# Patient Record
Sex: Female | Born: 1975 | Race: White | Hispanic: No | Smoking: Never smoker
Health system: Southern US, Community
[De-identification: ages and names within clinical notes are randomized; demographics above are authoritative.]

## PROBLEM LIST (undated history)

## (undated) DIAGNOSIS — F32A Depression, unspecified: Secondary | ICD-10-CM

## (undated) DIAGNOSIS — R519 Headache, unspecified: Secondary | ICD-10-CM

## (undated) DIAGNOSIS — F419 Anxiety disorder, unspecified: Secondary | ICD-10-CM

## (undated) DIAGNOSIS — F329 Major depressive disorder, single episode, unspecified: Secondary | ICD-10-CM

## (undated) DIAGNOSIS — R51 Headache: Secondary | ICD-10-CM

## (undated) DIAGNOSIS — R5383 Other fatigue: Secondary | ICD-10-CM

## (undated) HISTORY — DX: Anxiety disorder, unspecified: F41.9

## (undated) HISTORY — PX: BARIATRIC SURGERY: SHX1103

## (undated) HISTORY — DX: Depression, unspecified: F32.A

## (undated) HISTORY — DX: Headache: R51

## (undated) HISTORY — DX: Headache, unspecified: R51.9

## (undated) HISTORY — DX: Other fatigue: R53.83

## (undated) HISTORY — DX: Major depressive disorder, single episode, unspecified: F32.9

---

## 1998-07-30 ENCOUNTER — Emergency Department (HOSPITAL_COMMUNITY): Admission: EM | Admit: 1998-07-30 | Discharge: 1998-07-30 | Payer: Self-pay | Admitting: Emergency Medicine

## 2002-06-22 ENCOUNTER — Other Ambulatory Visit: Admission: RE | Admit: 2002-06-22 | Discharge: 2002-06-22 | Payer: Self-pay | Admitting: *Deleted

## 2003-05-10 ENCOUNTER — Other Ambulatory Visit: Admission: RE | Admit: 2003-05-10 | Discharge: 2003-05-10 | Payer: Self-pay | Admitting: Gynecology

## 2008-01-21 ENCOUNTER — Ambulatory Visit (HOSPITAL_COMMUNITY): Admission: RE | Admit: 2008-01-21 | Discharge: 2008-01-21 | Payer: Self-pay | Admitting: *Deleted

## 2008-02-09 ENCOUNTER — Encounter: Admission: RE | Admit: 2008-02-09 | Discharge: 2008-02-09 | Payer: Self-pay | Admitting: *Deleted

## 2008-06-03 ENCOUNTER — Encounter: Admission: RE | Admit: 2008-06-03 | Discharge: 2008-07-19 | Payer: Self-pay | Admitting: *Deleted

## 2008-06-27 ENCOUNTER — Ambulatory Visit (HOSPITAL_COMMUNITY): Admission: RE | Admit: 2008-06-27 | Discharge: 2008-06-27 | Payer: Self-pay | Admitting: *Deleted

## 2009-02-13 ENCOUNTER — Emergency Department (HOSPITAL_COMMUNITY): Admission: EM | Admit: 2009-02-13 | Discharge: 2009-02-13 | Payer: Self-pay | Admitting: Emergency Medicine

## 2011-02-06 LAB — COMPREHENSIVE METABOLIC PANEL
BUN: 10 mg/dL (ref 6–23)
CO2: 27 mEq/L (ref 19–32)
Chloride: 103 mEq/L (ref 96–112)
Creatinine, Ser: 0.85 mg/dL (ref 0.4–1.2)
GFR calc non Af Amer: >60 mL/min (ref >60)
Glucose, Bld: 87 mg/dL (ref 70–99)
Total Bilirubin: 0.7 mg/dL (ref 0.3–1.2)

## 2011-02-06 LAB — PREGNANCY, URINE: Preg Test, Ur: NEGATIVE

## 2011-02-06 LAB — URINALYSIS, ROUTINE W REFLEX MICROSCOPIC
Glucose, UA: NEGATIVE mg/dL
Ketones, ur: 15 mg/dL — AB
Nitrite: NEGATIVE
Protein, ur: NEGATIVE mg/dL

## 2011-02-06 LAB — CBC
HCT: 40 % (ref 36.0–46.0)
Hemoglobin: 13.5 g/dL (ref 12.0–15.0)
WBC: 12.6 10*3/uL — ABNORMAL HIGH (ref 4.0–10.5)

## 2011-02-06 LAB — DIFFERENTIAL
Eosinophils Relative: 3 % (ref 0–5)
Lymphocytes Relative: 31 % (ref 12–46)
Lymphs Abs: 4 10*3/uL (ref 0.7–4.0)
Monocytes Absolute: 0.7 10*3/uL (ref 0.1–1.0)

## 2011-03-12 NOTE — Op Note (Signed)
NAME:  Veronica Lawrence, Veronica Lawrence                ACCOUNT NO.:  [id number]   MEDICAL RECORD NO.:  [id number]          PATIENT TYPE:  AMB   LOCATION:  DAY                          FACILITY:  [facility]   PHYSICIAN:  Alfonse Ras, MD   DATE OF BIRTH:  04-30-1976   DATE OF PROCEDURE:  DATE OF DISCHARGE:                               OPERATIVE REPORT   ADDENDUM   DIAGNOSIS:  Medically refractory morbid obesity.  BMI is 47.2.      Alfonse Ras, MD  Electronically Signed     KRE/MEDQ  D:  06/27/2008  T:  06/28/2008  Job:  [id number]

## 2011-03-12 NOTE — Op Note (Signed)
NAME:  Veronica Lawrence, Veronica Lawrence                ACCOUNT NO.:  [id number]   MEDICAL RECORD NO.:  [id number]          PATIENT TYPE:  AMB   LOCATION:  DAY                          FACILITY:  [facility]   PHYSICIAN:  Alfonse Ras, MD   DATE OF BIRTH:  [birth year]   DATE OF PROCEDURE:  DATE OF DISCHARGE:                               OPERATIVE REPORT   PREOPERATIVE DIAGNOSIS:  Medically refractory morbid obesity.   POSTOPERATIVE DIAGNOSIS:  Medically refractory morbid obesity.   PROCEDURE:  Laparoscopic adjustable gastric banding with an APS Allergan  system and subcutaneous port placement.   SURGEON:  Alfonse Ras, M.D.   ASSISTANT:  Thornton Park. Daphine Deutscher, M.D.   ANESTHESIA:  General.   DESCRIPTION OF PROCEDURE:  The patient was taken to the operating room  and placed in supine position.  After adequate anesthesia was induced  using endotracheal tube, the abdomen was prepped and draped in normal  sterile fashion.  Using an 11-mm OptiVu trocar in the left upper  quadrant, peritoneal access was obtained under direct vision.  Pneumoperitoneum was obtained.  Additional 15-mm and 11-mm ports were  placed in the right abdomen and 11-mm port was placed in the right  paramedian position.  Nathanson liver retractor was introduced in the  subxiphoid region.  The left lateral segment liver was retracted.  Angle  of Hiss was sharply and bluntly dissected.  Using a pars flaccida  technique, the band passer was placed in a retrogastric position and  brought out at the angle of Hiss.  An APS band was then introduced and  passed retrogastric.  The sizing balloon or sizing tube was placed down,  insufflated with 15 mL of air and retracted.  There was no evidence of  hiatal hernia.  The tubing was then snapped down around the tubing and  moved easily.  The sizing tube was removed.  Anterior fundoplication was  performed with three 2-0 Ethilon sutures in the standard fashion.  Tubing was brought out through the  right upper quadrant incision.  The  tubing was attached to the port.  Nathanson liver retractor was removed.  All trocars were removed after pneumoperitoneum was released.  A small  pocket was bluntly made in the subcutaneous tissue just posterior to  Scarpa fascia and the port was placed within this pocket.  The tubing  laid nicely.  Scarpa fascia was closed with interrupted 4-0 Vicryl  sutures.  Skin incision was closed with 3-0 Monocryl.  Other skin  incisions were closed with subcuticular 4-0 Vicryl.  Dermabond and Steri-  Strips were placed.  Sterile dressings were applied.  The patient  tolerated the procedure well, went to PACU in good condition.      Alfonse Ras, MD  Electronically Signed     KRE/MEDQ  D:  06/27/2008  T:  06/28/2008  Job:  [id number]

## 2011-12-31 ENCOUNTER — Telehealth (INDEPENDENT_AMBULATORY_CARE_PROVIDER_SITE_OTHER): Payer: Self-pay | Admitting: Surgery

## 2011-12-31 NOTE — Telephone Encounter (Signed)
12/31/11 mailed recall letter for bariatric surgery follow-up. Advised the patient to call [facility] @ [phone] to schedule an appointment...cef °

## 2012-11-17 DIAGNOSIS — K219 Gastro-esophageal reflux disease without esophagitis: Secondary | ICD-10-CM | POA: Insufficient documentation

## 2013-02-04 ENCOUNTER — Telehealth (INDEPENDENT_AMBULATORY_CARE_PROVIDER_SITE_OTHER): Payer: Self-pay | Admitting: Surgery

## 2013-02-04 NOTE — Telephone Encounter (Signed)
02/04/13 lm and mailed recall letter for pt to schedule bariatric follow-up appt. Dr. Daphine Deutscher did lap band surgery 06/27/08. (lss)

## 2013-07-16 DIAGNOSIS — M545 Low back pain, unspecified: Secondary | ICD-10-CM | POA: Insufficient documentation

## 2013-07-28 HISTORY — PX: BACK SURGERY: SHX140

## 2014-05-28 HISTORY — PX: BACK SURGERY: SHX140

## 2015-03-09 ENCOUNTER — Ambulatory Visit (HOSPITAL_COMMUNITY): Payer: Managed Care, Other (non HMO) | Admitting: Licensed Clinical Social Worker

## 2015-03-09 ENCOUNTER — Ambulatory Visit (INDEPENDENT_AMBULATORY_CARE_PROVIDER_SITE_OTHER): Payer: Managed Care, Other (non HMO) | Admitting: Psychiatry

## 2015-03-09 ENCOUNTER — Encounter (HOSPITAL_COMMUNITY): Payer: Self-pay | Admitting: Psychiatry

## 2015-03-09 VITALS — BP 126/80 | HR 68 | Ht 66.0 in | Wt 210.0 lb

## 2015-03-09 DIAGNOSIS — F1190 Opioid use, unspecified, uncomplicated: Secondary | ICD-10-CM

## 2015-03-09 DIAGNOSIS — F411 Generalized anxiety disorder: Secondary | ICD-10-CM | POA: Diagnosis not present

## 2015-03-09 DIAGNOSIS — F329 Major depressive disorder, single episode, unspecified: Secondary | ICD-10-CM

## 2015-03-09 DIAGNOSIS — M549 Dorsalgia, unspecified: Secondary | ICD-10-CM

## 2015-03-09 DIAGNOSIS — G8929 Other chronic pain: Secondary | ICD-10-CM | POA: Diagnosis not present

## 2015-03-09 DIAGNOSIS — F32A Depression, unspecified: Secondary | ICD-10-CM

## 2015-03-09 MED ORDER — BUPROPION HCL 100 MG PO TABS: 100.0000 mg | ORAL_TABLET | Freq: Every morning | ORAL | Status: DC

## 2015-03-09 NOTE — Progress Notes (Signed)
Patient ID: Gwenlyn FudgeCynthia M Schwer, female   DOB: 03/24/1976, 39 y.o.   MRN: [id number]  [facility]   Gwenlyn FudgeCynthia M Rhoda [id number] 38 y.o.  03/09/2015 11:09 AM  Chief Complaint:  Depression. Opiate withdrawal in past. stress  History of Present Illness:   Patient Presents for Initial Evaluation with symptoms of stress, anxiety. Patient is a 39 years old, single Caucasian female was following with Dr.  Jean RosenthalJackson.  Patient is on pain medication hydrocodone takes as prescribed. She has been on medications for more than a year because of back problem including herniated disc she has had 2 surgeries. A month ago she stopped cold Malaw[place]urkey and started having withdrawal including nausea muscle ache diarrhea. She also had panic attack and anxiety. She was put back on medication now she is taking it her depression is improved does not endorse hopelessness helplessness. She was referred because of opiate withdrawal and to consider psychotherapy. Says that she did not want to be on long-term pain medication for which she stopped. She was also referred to Narcotics Anonymous  In regarding to depression she has had depression before she has had divorced 2 years ago. Her job is stressful but she stills like it.  She feels her depression and anxiety are manageable with the current medication regimen she is continuing to take pain medications and she does endorse feeling somewhat tired and that affects her daytime functioning at times. Modifying factors include she goes to the gym but has limited his social activities.  Severity of anxiety. 4 out of 10. 10 being extreme anxiety. Says that anxiety is better now she is on pack on pain medication he also takes Cymbalta thus also depression and anxiety. Severity of depression; 8 out of 10. 10 being no depression Context; pain, at times job stress Associate symptoms; she does worry associates worries with anxiety racing thoughts at  night. Some muscle tightening and difficulty sleeping. She does believe her worries are excessive and unreasonable at times. She worries about her physical health including her pain and treatment options. There is no suicidal symptoms of psychosis, mania hypomania in the past. No history of physical or sexual abuse. Her marriage was for 7 years but it was more so emotional distant and he was not working so she had to let him go    infrequent use of alcohol no history of alcoholism.    Past Psychiatric History/Hospitalization(s) No. Has followed with primar care for depression and anxiety.  Hospitalization for psychiatric illness: No History of Electroconvulsive Shock Therapy: No Prior Suicide Attempts: No  Medical History; Past Medical History  Diagnosis Date  . Anxiety   . Depression   . Fatigue   . Headache     Allergies: Allergies  Allergen Reactions  . Nsaids Nausea And Vomiting    Medications: Outpatient Encounter Prescriptions as of 03/09/2015  Medication Sig  . DULoxetine (CYMBALTA) 60 MG capsule   . HYDROcodone-acetaminophen (NORCO) 10-325 MG per tablet Take 1 tablet by mouth.  . Multiple Vitamin (THERA) TABS Take 1 tablet by mouth.  Marland Kitchen. buPROPion (WELLBUTRIN) 100 MG tablet Take 1 tablet (100 mg total) by mouth every morning.  . [DISCONTINUED] amphetamine-dextroamphetamine (ADDERALL) 15 MG tablet Take 15 mg by mouth.  . [DISCONTINUED] desogestrel-ethinyl estradiol (APRI,EMOQUETTE,SOLIA) 0.15-30 MG-MCG tablet Take 1 tablet by mouth.  . [DISCONTINUED] diazepam (VALIUM) 5 MG tablet   . [DISCONTINUED] DULoxetine (CYMBALTA) 60 MG capsule   . [DISCONTINUED] gabapentin (NEURONTIN) 300 MG capsule   . [  DISCONTINUED] HYDROcodone-acetaminophen (NORCO/VICODIN) 5-325 MG per tablet   . [DISCONTINUED] predniSONE (STERAPRED UNI-PAK) 10 MG tablet    No facility-administered encounter medications on file as of 03/09/2015.     Substance Abuse History: Denies.  Infrequent use of  alcohol . No alcoholilsm or regular use.   Family History; Family History  Problem Relation Age of Onset  . Dementia Maternal Grandmother      Biopsychosocial History:  She grew the parents had one sibling. Growing up was "no physical or sexual abuse. She did finish high school currently she is working as a Production designer, theatre/television/filmmanager at [employer]. She is remarried in the past for 7 years but they grew apart. No current legal issue. Patient does not have any kids    Labs:  No results found for this or any previous visit (from the past 2160 hour(s)).     Musculoskeletal: Strength & Muscle Tone: within normal limits Gait & Station: normal Patient leans: N/A  Mental Status Examination;   Psychiatric Specialty Exam: Physical Exam  Constitutional: She appears well-developed and well-nourished. No distress.    Review of Systems  Constitutional: Positive for malaise/fatigue.  Gastrointestinal: Negative for nausea.  Musculoskeletal: Positive for back pain.  Skin: Negative for rash.  Neurological: Negative for headaches.  Psychiatric/Behavioral: Negative for suicidal ideas.    Blood pressure 126/80, pulse 68, height 5\' 6"  (1.676 m), weight 210 lb (95.255 kg), SpO2 97 %.Body mass index is 33.91 kg/(m^2).  General Appearance: Casual  Eye Contact::  Good  Speech:  Normal Rate  Volume:  Normal  Mood:  Dysphoric somewhat but not hopeless. More so tired .  Affect:  Constricted  Thought Process:  Coherent  Orientation:  Full (Time, Place, and Person)  Thought Content:  Rumination  Suicidal Thoughts:  No  Homicidal Thoughts:  No  Memory:  Immediate;   Fair Recent;   Fair  Judgement:  Fair  Insight:  Fair  Psychomotor Activity:  Normal  Concentration:  Fair  Recall:  Fair  Akathisia:  Negative  Handed:  Right  AIMS (if indicated):     Assets:  Desire for Improvement Financial Resources/Insurance  Sleep:        Assessment: Axis I:  dysthymia. Generalized anxiety disorder. Chronic back  pain. Opiate use disorder prescribed   Axis II Deferred Axis III:  Past Medical History  Diagnosis Date  . Anxiety   . Depression   . Fatigue   . Headache     Axis : Psychosocial    Treatment Plan and Summary: Patient is not in any withdrawal as of now she is taking her pain medication. Her main concern today and tiredness.  Depression wise she is doing reasonably talked about other possibilities of causing tiredness including possible sleep apnea. Also schedule more activities and she is not working including hobbies.  Recommend psychotherapy to review underlying depression and stress levels.  I will add Wellbutrin 100 mg to augment as an antidepressant also for the tiredness.  She'll talk with her primary care physician in case she wants to reduce the pain medication to 2 tablets slowly in case there is that may help her tiredness without compromising her pain   Pertinent Labs and Relevant Prior Notes reviewed. Medication Side effects, benefits and risks reviewed/discussed with Patient. Time given for patient to respond and asks questions regarding the Diagnosis and Medications. Safety concerns and to report to ER if suicidal or call 911. Relevant Medications refilled or called in to pharmacy. Discussed weight maintenance and Sleep  Hygiene. Follow up with Primary care provider in regards to Medical conditions. Recommend compliance with medications and follow up office appointments. Discussed to avail opportunity to consider or/and continue Individual therapy with Counselor. Greater than 50% of time was spend in counseling and coordination of care with the patient.  Schedule for Follow up visit in 4 weeks  or call in earlier as necessary.   Thresa Ross, MD 03/09/2015

## 2015-03-30 ENCOUNTER — Telehealth (HOSPITAL_COMMUNITY): Payer: Self-pay | Admitting: *Deleted

## 2015-03-30 NOTE — Telephone Encounter (Signed)
PT called for a refill for Wellbutrin. Pt was seen by Dr. Gilmore LarocheAkhtar on 5/12. Pt states she is out of medication and will need a refill due to her taking 2 tablets daily. Informed pt I will need to speak with Dr. Gilmore LarocheAkhtar for approval before a refill can be issued. Pt states she will call tomorrow to speak with Dr. Gilmore LarocheAkhtar.

## 2015-03-31 ENCOUNTER — Telehealth (HOSPITAL_COMMUNITY): Payer: Self-pay

## 2015-03-31 MED ORDER — BUPROPION HCL 100 MG PO TABS: 100.0000 mg | ORAL_TABLET | Freq: Every morning | ORAL | Status: DC

## 2015-03-31 MED ORDER — BUPROPION HCL 100 MG PO TABS: ORAL_TABLET | ORAL | Status: DC

## 2015-03-31 NOTE — Telephone Encounter (Signed)
wellbutrin refill sent for 100mg  qd.

## 2015-03-31 NOTE — Telephone Encounter (Signed)
wellbutrin dose changed to 200mg  and sent to pharmacy

## 2015-03-31 NOTE — Telephone Encounter (Signed)
PT needs a refill for her wellbutrin she did start taking 2 a day per her conversation with you so she is out of the medication sooner than she should be. buPROPion (WELLBUTRIN) 100 MG tablet The refill needs to be for 200mg .

## 2015-04-13 ENCOUNTER — Ambulatory Visit (HOSPITAL_COMMUNITY): Payer: Self-pay | Admitting: Psychiatry

## 2015-04-26 ENCOUNTER — Encounter (HOSPITAL_COMMUNITY): Payer: Self-pay | Admitting: Licensed Clinical Social Worker

## 2015-04-26 ENCOUNTER — Ambulatory Visit (INDEPENDENT_AMBULATORY_CARE_PROVIDER_SITE_OTHER): Payer: Managed Care, Other (non HMO) | Admitting: Licensed Clinical Social Worker

## 2015-04-26 ENCOUNTER — Other Ambulatory Visit (HOSPITAL_COMMUNITY): Payer: Self-pay | Admitting: Psychiatry

## 2015-04-26 DIAGNOSIS — F321 Major depressive disorder, single episode, moderate: Secondary | ICD-10-CM | POA: Insufficient documentation

## 2015-04-26 NOTE — Progress Notes (Signed)
Patient:   Veronica Lawrence   DOB:   11/06/1975  MR Number:  [id number]  [facility]  [facility] [address] [place] [id number] Dept: [phone]           Date of Service:   04/26/15 Start Time:   2:30pm End Time:   3:15pm  Provider/Observer:  Marilu FavreSarah A Solomon Clinical Social Work       Billing Code/Service: [phone]  Comprehensive Clinical Assessment  Information for assessment provided by: patient   Chief Complaint:    Depression and anxiety     Presenting Problem/Symptoms:  Anxiety, depression, low energy  Concerns for "a couple of years" with symptoms worsening gradually over time.       Previous MH/SA diagnoses:  none      Mental Health Symptoms:    Depression:  PHQ-9= 13 (moderate)   Current symptoms include depressed mood, anhedonia, insomnia, fatigue, feelings of worthlessness/guilt, difficulty concentrating, increased appetite, decreased appetite,.    Past episodes of depression: denies  Anxiety:  Excessive worry, trouble relaxing, irritability   Panic Attacks: had one a few weeks ago  Self-Harm Potential: Thoughts of Self-Harm: none Is there a family history of suicide? Previous attempts? no  History of acts of self-harm? no  Dangerousness to Others Potential: Denies Family history of violence? no Previous attempts? no    Mania/hypomania: denies     Psychosis:  denies    Abuse/Trauma History:  denies  PTSD symptoms: na     Mental Status  Interactions:    Active   Attention:   Good  Memory:   Intact  Speech:   Normal   Flow of Thought:  Normal  Thought Content:  Rumination  Orientation:   person, place and situation  Judgment:   Good, but admits she can be impulsive  Affect/Mood:   Appropriate  Insight:   Good        Medical History:    Past Medical History  Diagnosis Date  . Anxiety   . Depression   .  Fatigue   . Headache    Had 2 back surgeries, most recent August 2015, October 2014 Had a gastric sleeve December 2011.   Current medications:         Outpatient Encounter Prescriptions as of 04/26/2015  Medication Sig  . buPROPion (WELLBUTRIN) 100 MG tablet Please delete prior refill of 100mg . Change it to 200mg  total dose per day.  . DULoxetine (CYMBALTA) 60 MG capsule   . HYDROcodone-acetaminophen (NORCO) 10-325 MG per tablet Take 1 tablet by mouth.  . Multiple Vitamin (THERA) TABS Take 1 tablet by mouth.   No facility-administered encounter medications on file as of 04/26/2015.         Was on hydrocodone for a year.  Stopped taking it cold Malawiturkey about a month ago.  Experienced opiate withdrawals (sweating, itching, couldn't sleep)  "I had to go back on it because nothing else was helping with the pain."       Mental Health/Substance Use Treatment History:   No previous treatment      Family Med/Psych History:  Family History  Problem Relation Age of Onset  . Dementia Maternal Grandmother        Substance Use History:    Alcohol- a couple beers or a glass of wine a couple days a week, drank more frequently after divorce     Marital Status: Divorced for 2.5 years  Had  been married to [place] for 7 years, together 7 years prior to that.  She asked him to leave.  There was very little effort on his part towards getting a job.  Stopped being intimate with one another.    Not currently in a relationship  Lives with: self  Family Relationships:  No children- wanted to have children but husband did not Parents are together.  Live in [place].  Relationship with both is "great" One sister 72 years older- very close  Lives in [place].  Grew up in [place].     Other Social Supports: Best friend lives in [place], [place].  One friend locally.    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  Likes her job despite it being  stressful.  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    Hobbies:  "I don't really have any hobbies"  "I'm pretty bad about starting a project and not finishing it."  Used to enjoy [employer].   Strengths/Protective Factors: "I'm pretty compassionate, to a fault at times"          Impression/DX: F32.1 Major Depressive Disorder, single episode, moderate   Disposition/Plan: Recommending individual therapy with a focus on reducing symptoms of depression.  Interventions will include helping patient identify and change negative or irrational thinking patterns and teaching skills for stress management, behavioral activation, mindfulness, and interpersonal effectiveness.

## 2015-04-27 NOTE — Telephone Encounter (Signed)
Received fax from [facility] for a refill for Wellbutrin 100mg . Per Dr. Gilmore LarocheAkhtar, pt is authorized for a refill for Wellbutrin 100mg , Qty 60. Prescription was sent to pharmacy. Pt has a f/u appt on 7/27. Pt verbalizes understanding.

## 2015-05-10 ENCOUNTER — Ambulatory Visit (INDEPENDENT_AMBULATORY_CARE_PROVIDER_SITE_OTHER): Payer: Managed Care, Other (non HMO) | Admitting: Licensed Clinical Social Worker

## 2015-05-10 DIAGNOSIS — F321 Major depressive disorder, single episode, moderate: Secondary | ICD-10-CM

## 2015-05-10 NOTE — Progress Notes (Signed)
   THERAPIST PROGRESS NOTE  Session Time: 11:10am-12:05pm  Participation Level: Active  Behavioral Response: Well GroomedAlertDepressed  Type of Therapy: Individual Therapy  Treatment Goals addressed: decrease depressive symptoms  Interventions: psycho-education and behavioral activation  Suicidal/Homicidal:  Admits to wishing she could sleep until she feels better, but denies any intent to harm self, denies HI   Therapist Interventions: Reviewed symptoms of depression and potential causes.  Talked about stigma and the importance of support.  Discussed the importance of engaging in activities even when you don't feel like it.  Encouraged patient to set daily goals based on how she is feeling and emphasized importance of giving yourself credit for things you are accomplishing.      Summary: Talked about changes in thinking patterns she has experienced.  Thoughts tend to be negative and self-critical.  Noted that she realizes much of her thinking is irrational.  Frustrated about not being able to change it.  Made a comment that she "doesn't know what she likes anymore."  She is having to redefine herself after divorcing her husband.  In general says she isn't engaging in much activity beyond going to work.  Did note that she has gone to the gym 3-4 days per week.  Plan: Return again in approximately 2 weeks.  Will introduce cognitive model and Thought Journal.  Diagnosis: Major Depressive Disorder, moderate    Darrin LuisSolomon, Sarah A, LCSW 05/10/2015

## 2015-05-18 ENCOUNTER — Ambulatory Visit (INDEPENDENT_AMBULATORY_CARE_PROVIDER_SITE_OTHER): Payer: Managed Care, Other (non HMO) | Admitting: Psychiatry

## 2015-05-18 ENCOUNTER — Encounter (HOSPITAL_COMMUNITY): Payer: Self-pay | Admitting: Psychiatry

## 2015-05-18 VITALS — HR 82 | Ht 66.0 in | Wt 210.0 lb

## 2015-05-18 DIAGNOSIS — M549 Dorsalgia, unspecified: Secondary | ICD-10-CM

## 2015-05-18 DIAGNOSIS — F341 Dysthymic disorder: Secondary | ICD-10-CM | POA: Diagnosis not present

## 2015-05-18 DIAGNOSIS — G8929 Other chronic pain: Secondary | ICD-10-CM

## 2015-05-18 DIAGNOSIS — F1190 Opioid use, unspecified, uncomplicated: Secondary | ICD-10-CM

## 2015-05-18 DIAGNOSIS — F411 Generalized anxiety disorder: Secondary | ICD-10-CM

## 2015-05-18 DIAGNOSIS — F321 Major depressive disorder, single episode, moderate: Secondary | ICD-10-CM

## 2015-05-18 MED ORDER — DULOXETINE HCL 60 MG PO CPEP: 60.0000 mg | ORAL_CAPSULE | Freq: Every day | ORAL | Status: DC

## 2015-05-18 MED ORDER — BUPROPION HCL 100 MG PO TABS: 200.0000 mg | ORAL_TABLET | Freq: Every day | ORAL | Status: DC

## 2015-05-18 NOTE — Progress Notes (Signed)
Patient ID: Veronica Lawrence, female   DOB: 1976-06-12, 39 y.o.   MRN: [id number]  [facility] Outpatient follow up visit  Veronica Lawrence [id number] 39 y.o.  05/18/2015 10:15 AM  Chief Complaint:  Depression. Opiate withdrawal in past. Stress. depression  History of Present Illness:   Patient Presents for follow up and medication management for depression, anxiety.  Patient is a 39 years old, single Caucasian female was following with Dr.  Jean Rosenthal.  Patient is on pain medication hydrocodone takes as prescribed. She has been on medications for more than a year because of back problem including herniated disc she has had 2 surgeries. A month ago she stopped cold [place] and started having withdrawal including nausea muscle ache diarrhea. She also had panic attack and anxiety. She was put back on medication now she is taking it her depression is improved does not endorse hopelessness helplessness. She was referred because of opiate withdrawal and to consider psychotherapy. Says that she did not want to be on long-term pain medication for which she stopped. She was also referred to Narcotics Anonymous  Depression is reasonable on cymbalta . wellbutrin has helped tiredness but comlains of evening tiredness. Orthopedic wants psychiatry to prescribe cymbalta. She has been running low and now feeling down and moody. Modifying factors include she goes to the gym but has limited his social activities.  Severity of anxiety. 4 out of 10. 10 being extreme anxiety. Says that anxiety is better now she is on pack on pain medication he also takes Cymbalta thus also depression and anxiety. Severity of depression; 8 out of 10. 10 being no depression Context; pain, at times job stress Associate symptoms; she does worry associates worries with anxiety racing thoughts at night. Some muscle tightening and difficulty sleeping. She does believe her worries are excessive and unreasonable at times. She worries  about her physical health including her pain and treatment options. There is no suicidal symptoms of psychosis, mania hypomania in the past. No history of physical or sexual abuse. Her marriage was for 7 years but it was more so emotional distant and he was not working so she had to let him go    infrequent use of alcohol no history of alcoholism.    Past Psychiatric History/Hospitalization(s) No. Has followed with primar care for depression and anxiety.   Prior Suicide Attempts: No  Medical History; Past Medical History  Diagnosis Date  . Anxiety   . Depression   . Fatigue   . Headache     Allergies: Allergies  Allergen Reactions  . Nsaids Nausea And Vomiting    Medications: Outpatient Encounter Prescriptions as of 05/18/2015  Medication Sig  . buPROPion (WELLBUTRIN) 100 MG tablet Take 2 tablets (200 mg total) by mouth daily.  . DULoxetine (CYMBALTA) 60 MG capsule Take 1 capsule (60 mg total) by mouth daily.  Marland Kitchen HYDROcodone-acetaminophen (NORCO) 10-325 MG per tablet Take 1 tablet by mouth.  . Multiple Vitamin (THERA) TABS Take 1 tablet by mouth.  . [DISCONTINUED] buPROPion (WELLBUTRIN) 100 MG tablet TAKE 2 TABLETS BY MOUTH DAILY  . [DISCONTINUED] DULoxetine (CYMBALTA) 60 MG capsule    No facility-administered encounter medications on file as of 05/18/2015.     Substance Abuse History: Denies.  Infrequent use of alcohol . No alcoholilsm or regular use.   Family History; Family History  Problem Relation Age of Onset  . Dementia Maternal Grandmother   . Bipolar disorder Maternal Grandmother  Labs:  No results found for this or any previous visit (from the past 2160 hour(s)).     Musculoskeletal: Strength & Muscle Tone: within normal limits Gait & Station: normal Patient leans: N/A  Mental Status Examination;   Psychiatric Specialty Exam: Physical Exam  Constitutional: She appears well-developed and well-nourished. No distress.    Review  of Systems  Constitutional: Positive for malaise/fatigue.  Gastrointestinal: Negative for nausea and vomiting.  Musculoskeletal: Positive for back pain.  Skin: Negative for rash.  Neurological: Negative for headaches.  Psychiatric/Behavioral: Positive for depression. Negative for suicidal ideas. The patient does not have insomnia.     Pulse 82, height 5\' 6"  (1.676 m), weight 210 lb (95.255 kg).Body mass index is 33.91 kg/(m^2).  General Appearance: Casual  Eye Contact::  Good  Speech:  Normal Rate  Volume:  Normal  Mood: episodic dysphoria but not hopeless  Affect:  Constricted  Thought Process:  Coherent  Orientation:  Full (Time, Place, and Person)  Thought Content:  Rumination  Suicidal Thoughts:  No  Homicidal Thoughts:  No  Memory:  Immediate;   Fair Recent;   Fair  Judgement:  Fair  Insight:  Fair  Psychomotor Activity:  Normal  Concentration:  Fair  Recall:  Fair  Akathisia:  Negative  Handed:  Right  AIMS (if indicated):     Assets:  Desire for Improvement Financial Resources/Insurance  Sleep:        Assessment: Axis I:  dysthymia. Generalized anxiety disorder. Chronic back pain. Opiate use disorder prescribed   Axis II Deferred Axis III:  Past Medical History  Diagnosis Date  . Anxiety   . Depression   . Fatigue   . Headache     Axis : Psychosocial    Treatment Plan and Summary:  Still endorses tiredness.  Will change wellbutrin to 100mg  bid. Orthopedic not prescribing cymbalta anymore.  Will re instate 60mg  qd.  She wants 90 day meds as per her insurance and cost benefit.   Pertinent Labs and Relevant Prior Notes reviewed. Medication Side effects, benefits and risks reviewed/discussed with Patient. Time given for patient to respond and asks questions regarding the Diagnosis and Medications. Safety concerns and to report to ER if suicidal or call 911. Relevant Medications refilled or called in to pharmacy. Discussed weight maintenance and  Sleep Hygiene. Follow up with Primary care provider in regards to Medical conditions. Recommend compliance with medications and follow up office appointments. Discussed to avail opportunity to consider or/and continue Individual therapy with Counselor. Greater than 50% of time was spend in counseling and coordination of care with the patient.  Schedule for Follow up visit in 8 weeks  or call in earlier as necessary.   Thresa Ross, MD 05/18/2015

## 2015-05-24 ENCOUNTER — Ambulatory Visit (INDEPENDENT_AMBULATORY_CARE_PROVIDER_SITE_OTHER): Payer: Managed Care, Other (non HMO) | Admitting: Licensed Clinical Social Worker

## 2015-05-24 ENCOUNTER — Ambulatory Visit (HOSPITAL_COMMUNITY): Payer: Self-pay | Admitting: Psychiatry

## 2015-05-24 DIAGNOSIS — F321 Major depressive disorder, single episode, moderate: Secondary | ICD-10-CM | POA: Diagnosis not present

## 2015-05-24 NOTE — Progress Notes (Signed)
THERAPIST PROGRESS NOTE  Session Time: 2:00pm-3:00pm  Participation Level: Active  Behavioral Response: Well Groomed Alert Depressed  Type of Therapy: Individual Therapy  Treatment Goals addressed: decrease depressive symptoms  Interventions: CBT  Suicidal/Homicidal: Continues to have thoughts wishing she could sleep until she feels better, but denies any intent to harm self, denies HI   Therapist Interventions: Introduced the [employer].  Emphasized that it is our thoughts about a situation that determines how we end up feeling rather than the situation itself.  Introduced a tool called a Dispensing optician which can be used to increase awareness of how your thoughts influence your feelings and behavior.  Guided patient through an example.  Provided patient with a page identifying different unhelpful thinking patterns.  Asked her to look it over and consider which ones she engages in most often.      Summary:    Indicated she thought the Thought Journal could be useful.  Talked about how she would like to be able to rationalize her thinking sooner than she typically does.    Reported taking time to focus on her own self-care.  Took a day off of work (her birthday) and did activities she finds pleasurable.  Continues to go to the gym.   Has enjoyed some activities, but would like to be enjoying things more.  Reports she doesn't feel like she is accomplishing much.   Agreed to look over the different unhelpful thinking styles.   Plan: Return again in approximately 2 weeks.    Diagnosis:Major Depressive Disorder, moderate    Darrin Luis 05/24/2015

## 2015-05-27 ENCOUNTER — Other Ambulatory Visit (HOSPITAL_COMMUNITY): Payer: Self-pay | Admitting: Psychiatry

## 2015-05-29 NOTE — Telephone Encounter (Signed)
Per dr. Gilmore Laroche, pt request for Wellbutrin  is denied. Prescription for Wellbutrin , Qty 180 was sent to pharmacy on 7/21.  PT has a f/u appt on 10/19.

## 2015-06-07 ENCOUNTER — Ambulatory Visit (INDEPENDENT_AMBULATORY_CARE_PROVIDER_SITE_OTHER): Payer: Managed Care, Other (non HMO) | Admitting: Licensed Clinical Social Worker

## 2015-06-07 DIAGNOSIS — F321 Major depressive disorder, single episode, moderate: Secondary | ICD-10-CM | POA: Diagnosis not present

## 2015-06-07 DIAGNOSIS — F411 Generalized anxiety disorder: Secondary | ICD-10-CM

## 2015-06-07 NOTE — Progress Notes (Signed)
THERAPIST PROGRESS NOTE  Session Time: 2:00pm-2:45pm  Participation Level: Active  Behavioral Response: Well Groomed Drowsy Depressed  Type of Therapy: Individual Therapy  Treatment Goals addressed: decrease depressive symptoms  Interventions: CBT  Suicidal/Homicidal: Continues to have thoughts wishing she could sleep until she feels better, but denies any intent to harm self, denies HI   Therapist Interventions:  Had patient complete a PHQ-9 and GAD-7 to assess for severity of depression and anxiety symptoms. Asked if patient had used the Thought Journal and/or looked over the list of unhelpful thinking styles. Introduced Statistician as a Comptroller for countering negative or irrational self-talk.   Introduced the concept of mindfulness.      Suggested they end the session early because she was having trouble focusing on account of being so exhausted.  Summary:  PHQ-9 score was 14 (moderate).  This is similar to the score she had at intake at the end of June. GAD-7 score was 13 (moderately severe).  The item she rated as experiencing nearly every day was irritability. Primary complaint today was of being tired.  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  Expects to have to continue this schedule for a couple more weeks.   Admitted that she does not like her job and she hasn't liked it for years.  Reports being in the early stages of figuring out where she can studying nursing.  Indicated she wants a job where she feels like she is helping others.   Overall has not been enjoying activities and not satisfied with accomplishments.         Plan: Return again in approximately 2 weeks.  Will expand on mindfulness.  Diagnosis:Major Depressive Disorder, moderate    Darrin Luis 06/07/2015

## 2015-06-21 ENCOUNTER — Ambulatory Visit (HOSPITAL_COMMUNITY): Payer: Self-pay | Admitting: Licensed Clinical Social Worker

## 2015-07-12 ENCOUNTER — Ambulatory Visit (HOSPITAL_COMMUNITY): Payer: Self-pay | Admitting: Licensed Clinical Social Worker

## 2015-07-26 ENCOUNTER — Ambulatory Visit (INDEPENDENT_AMBULATORY_CARE_PROVIDER_SITE_OTHER): Payer: Managed Care, Other (non HMO) | Admitting: Licensed Clinical Social Worker

## 2015-07-26 DIAGNOSIS — F411 Generalized anxiety disorder: Secondary | ICD-10-CM

## 2015-07-26 DIAGNOSIS — F320 Major depressive disorder, single episode, mild: Secondary | ICD-10-CM

## 2015-07-26 NOTE — Progress Notes (Signed)
THERAPIST PROGRESS NOTE  Session Time: 2:00pm-3:00pm  Participation Level: Active  Behavioral Response: Casual Alert Euthymic   Type of Therapy: Individual Therapy  Treatment Goals addressed: decrease depressive symptoms  Interventions: Mindfulness  Suicidal/Homicidal: Denied both  Therapist Interventions:  Gathered information about significant events and changes in mood and functioning since last seen approximately a month and a half ago. Had patient complete a PHQ-9 and GAD-7 to assess for severity of depression and anxiety symptoms. Informed patient of the opportunity to participate in a weekly group focused on developing mindfulness skills.   Explained how trying to get rid of unpleasant feelings can actually keep those unpleasant feelings going.   Described two distinct modes of thinking, Doing Mode and Being Mode.  Explained how they differ from one another and provided examples of times when one mode of thinking may be more effective than the other.   Noted that practice of mindfulness is the way to cultivate the Being Mode of thinking.  Summary:  Reported that she was hospitalized for a suicide attempt within days of her last therapy appointment.  She overdosed on Ativan.  Believes that the medication contributed to the development of suicidal ideation.  She spent about a week at [facility] then participated in a Partial Hospitalization program through [place] for about 3 weeks.  Indicated she got a lot out of the program.  Psych meds have been changed.  Mentioned that she has decided to continue medication management with Dr Mayford Knife rather than see Dr Alta Corning.     PHQ-9 score was 5 (mild).  This is a significant improvement.   GAD-7 score was 4 (mild).  Also a significant improvement.      Already somewhat familiar with the concept of mindfulness.  They apparently talked a lot about it in Partial Hospitalization.   Expressed interest in participating in the weekly  group.    Plan: Will join group therapy next Wednesday as long as her work schedule does not interfere.  Diagnosis:Major Depressive Disorder, mild    Darrin Luis 07/26/15

## 2015-08-02 ENCOUNTER — Ambulatory Visit (INDEPENDENT_AMBULATORY_CARE_PROVIDER_SITE_OTHER): Payer: Managed Care, Other (non HMO) | Admitting: Licensed Clinical Social Worker

## 2015-08-02 ENCOUNTER — Ambulatory Visit (HOSPITAL_COMMUNITY): Payer: Self-pay | Admitting: Licensed Clinical Social Worker

## 2015-08-02 DIAGNOSIS — F411 Generalized anxiety disorder: Secondary | ICD-10-CM | POA: Diagnosis not present

## 2015-08-02 DIAGNOSIS — F320 Major depressive disorder, single episode, mild: Secondary | ICD-10-CM | POA: Diagnosis not present

## 2015-08-03 NOTE — Progress Notes (Signed)
THERAPIST PROGRESS NOTE  Session Time: 11:05am-12:05pm  Participation Level: Active  Behavioral Response: Casual   Alert  Euthymic  Type of Therapy: Group Therapy  Treatment Goals addressed: decrease depressive symptoms  Interventions: MBCT   Suicidal/Homicidal: Denied both    Summary of Interventions:   Reviewed concept of mindfulness.  Talked about how living on automatic pilot puts [place] at risk of getting stuck in negative states of mind.   Practiced two mindfulness exercises: mindful eating and the body scan.  Encouraged group members to describe their experiences.   Explained how you can practice mindfulness by bringing awareness to routine activities.   Assigned homework to: . choose one routine activity and perform it in a mindful way . practice the body scan once a day . practice eating mindfully at least once a day         Plan:  Planning to return for group next week.  Diagnosis:      Generalized Anxiety Disorder                          Major Depressive Disorder, mild     Darrin Luis 08/02/2015

## 2015-08-04 ENCOUNTER — Ambulatory Visit (INDEPENDENT_AMBULATORY_CARE_PROVIDER_SITE_OTHER): Payer: Managed Care, Other (non HMO) | Admitting: Licensed Clinical Social Worker

## 2015-08-04 DIAGNOSIS — F320 Major depressive disorder, single episode, mild: Secondary | ICD-10-CM
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
## 2015-08-09 ENCOUNTER — Ambulatory Visit (HOSPITAL_COMMUNITY): Payer: Self-pay | Admitting: Licensed Clinical Social Worker

## 2015-08-16 ENCOUNTER — Ambulatory Visit (HOSPITAL_COMMUNITY): Payer: Self-pay | Admitting: Psychiatry

## 2015-08-16 ENCOUNTER — Ambulatory Visit (HOSPITAL_COMMUNITY): Payer: Self-pay | Admitting: Licensed Clinical Social Worker

## 2015-08-23 ENCOUNTER — Ambulatory Visit (HOSPITAL_COMMUNITY): Payer: Self-pay | Admitting: Licensed Clinical Social Worker

## 2015-08-30 ENCOUNTER — Ambulatory Visit (HOSPITAL_COMMUNITY): Payer: Self-pay | Admitting: Licensed Clinical Social Worker

## 2015-09-06 ENCOUNTER — Ambulatory Visit (HOSPITAL_COMMUNITY): Payer: Self-pay | Admitting: Licensed Clinical Social Worker

## 2015-09-13 ENCOUNTER — Ambulatory Visit (HOSPITAL_COMMUNITY): Payer: Managed Care, Other (non HMO) | Admitting: Licensed Clinical Social Worker

## 2015-09-20 ENCOUNTER — Ambulatory Visit (HOSPITAL_COMMUNITY): Payer: Managed Care, Other (non HMO) | Admitting: Licensed Clinical Social Worker

## 2015-09-27 ENCOUNTER — Ambulatory Visit (HOSPITAL_COMMUNITY): Payer: Managed Care, Other (non HMO) | Admitting: Licensed Clinical Social Worker

## 2015-10-04 ENCOUNTER — Ambulatory Visit (HOSPITAL_COMMUNITY): Payer: Self-pay | Admitting: Licensed Clinical Social Worker

## 2015-10-11 ENCOUNTER — Ambulatory Visit (HOSPITAL_COMMUNITY): Payer: Self-pay | Admitting: Licensed Clinical Social Worker

## 2015-10-18 ENCOUNTER — Ambulatory Visit (HOSPITAL_COMMUNITY): Payer: Self-pay | Admitting: Licensed Clinical Social Worker

## 2015-10-25 ENCOUNTER — Ambulatory Visit (HOSPITAL_COMMUNITY): Payer: Self-pay | Admitting: Licensed Clinical Social Worker

## 2016-07-31 ENCOUNTER — Encounter (HOSPITAL_COMMUNITY): Payer: Self-pay

## 2016-09-15 ENCOUNTER — Ambulatory Visit (HOSPITAL_COMMUNITY)
Admission: EM | Admit: 2016-09-15 | Discharge: 2016-09-15 | Disposition: A | Discharge status: Home / Self Care | Payer: Worker's Compensation | Attending: Emergency Medicine | Admitting: Emergency Medicine

## 2016-09-15 ENCOUNTER — Encounter (HOSPITAL_COMMUNITY): Payer: Self-pay | Admitting: *Deleted

## 2016-09-15 ENCOUNTER — Encounter (HOSPITAL_COMMUNITY): Payer: Self-pay | Admitting: Family Medicine

## 2016-09-15 ENCOUNTER — Emergency Department (HOSPITAL_COMMUNITY)
Admission: EM | Admit: 2016-09-15 | Discharge: 2016-09-16 | Disposition: A | Discharge status: Home / Self Care | Payer: Worker's Compensation | Attending: Emergency Medicine | Admitting: Emergency Medicine

## 2016-09-15 DIAGNOSIS — M5441 Lumbago with sciatica, right side: Secondary | ICD-10-CM

## 2016-09-15 DIAGNOSIS — Y990 Civilian activity done for income or pay: Secondary | ICD-10-CM | POA: Insufficient documentation

## 2016-09-15 DIAGNOSIS — M545 Low back pain: Secondary | ICD-10-CM | POA: Diagnosis present

## 2016-09-15 DIAGNOSIS — X501XXA Overexertion from prolonged static or awkward postures, initial encounter: Secondary | ICD-10-CM | POA: Insufficient documentation

## 2016-09-15 DIAGNOSIS — Y929 Unspecified place or not applicable: Secondary | ICD-10-CM | POA: Insufficient documentation

## 2016-09-15 DIAGNOSIS — Y9389 Activity, other specified: Secondary | ICD-10-CM | POA: Insufficient documentation

## 2016-09-15 MED ORDER — CYCLOBENZAPRINE HCL 10 MG PO TABS: 10.0000 mg | ORAL_TABLET | Freq: Two times a day (BID) | ORAL | 0 refills | Status: DC | PRN

## 2016-09-15 MED ORDER — HYDROCODONE-ACETAMINOPHEN 5-325 MG PO TABS
2.0000 | ORAL_TABLET | Freq: Once | ORAL | Status: AC
  Administered 2016-09-15: 2 via ORAL

## 2016-09-15 MED ORDER — DIAZEPAM 5 MG PO TABS
10.0000 mg | ORAL_TABLET | Freq: Once | ORAL | Status: AC
  Administered 2016-09-15: 10 mg via ORAL
  Filled 2016-09-15: qty 2

## 2016-09-15 MED ORDER — HYDROCODONE-ACETAMINOPHEN 5-325 MG PO TABS
ORAL_TABLET | ORAL | Status: AC
  Filled 2016-09-15: qty 2

## 2016-09-15 MED ORDER — HYDROMORPHONE HCL 2 MG/ML IJ SOLN
1.0000 mg | Freq: Once | INTRAMUSCULAR | Status: AC
  Administered 2016-09-15: 1 mg via INTRAMUSCULAR
  Filled 2016-09-15: qty 1

## 2016-09-15 MED ORDER — HYDROCODONE-ACETAMINOPHEN 5-325 MG PO TABS: 2.0000 | ORAL_TABLET | Freq: Four times a day (QID) | ORAL | 0 refills | Status: DC | PRN

## 2016-09-15 MED ORDER — ACETAMINOPHEN 500 MG PO TABS
1000.0000 mg | ORAL_TABLET | Freq: Once | ORAL | Status: AC
Start: 2016-09-15 — End: 2016-09-15
  Administered 2016-09-15: 1000 mg via ORAL
  Filled 2016-09-15: qty 2

## 2016-09-15 NOTE — ED Triage Notes (Signed)
Pt here for lower back pain after twisting back today at work. sts numbness in right foot. sts 2 back surgeries. sts sharp and shooting down right leg.

## 2016-09-15 NOTE — ED Notes (Signed)
Pt states she has numbness and tingling in R leg and foot. Pt states [facility].

## 2016-09-15 NOTE — ED Provider Notes (Signed)
[facility]-EMERGENCY DEPT Provider Note   CSN: [id number] Arrival date & time: 09/15/16  1957     History   Chief Complaint Chief Complaint  Patient presents with  . Back Pain    HPI Veronica Lawrence is a 40 y.o. female.  Patient presents with lower back pain with radiation down the right posterior leg. Patient has a history of 2 laminectomies. Patient was twisting at work and had sudden pain that radiated down the right leg. Patient denies any focal weakness however she does have numbness and pain. Patient has not had recent incontinence. This injury happened at work.      Past Medical History:  Diagnosis Date  . Anxiety   . Depression   . Fatigue   . Headache     Patient Active Problem List   Diagnosis Date Noted  . Major depressive disorder, single episode, moderate (HCC) 04/26/2015  . LBP (low back pain) 07/16/2013  . Acid reflux 11/17/2012    Past Surgical History:  Procedure Laterality Date  . BACK SURGERY  August 2015  . BACK SURGERY  October 2014    OB History    No data available       Home Medications    Prior to Admission medications   Medication Sig Start Date End Date Taking? Authorizing Provider  omeprazole (PRILOSEC) 20 MG capsule Take 20 mg by mouth daily.  06/16/15  Yes Historical Provider, MD  cyclobenzaprine (FLEXERIL) 10 MG tablet Take 1 tablet (10 mg total) by mouth 2 (two) times daily as needed for muscle spasms. 09/15/16   Blane OharaJoshua Chaunte Hornbeck, MD  HYDROcodone-acetaminophen (NORCO) 5-325 MG tablet Take 2 tablets by mouth every 6 (six) hours as needed. 09/15/16   Blane OharaJoshua Hadyn Blanck, MD    Family History Family History  Problem Relation Age of Onset  . Dementia Maternal Grandmother   . Bipolar disorder Maternal Grandmother     Social History Social History  Substance Use Topics  . Smoking status: Never Smoker  . Smokeless tobacco: Never Used  . Alcohol use 0.6 - 1.2 oz/week    1 - 2 Cans of beer per week     Allergies   Nsaids   Review  of Systems Review of Systems  Constitutional: Negative for chills and fever.  HENT: Negative for ear pain and sore throat.   Eyes: Negative for pain and visual disturbance.  Respiratory: Negative for cough and shortness of breath.   Cardiovascular: Negative for chest pain and palpitations.  Gastrointestinal: Negative for abdominal pain and vomiting.  Genitourinary: Negative for dysuria and hematuria.  Musculoskeletal: Positive for back pain. Negative for arthralgias.  Skin: Negative for color change and rash.  Neurological: Positive for numbness. Negative for seizures, syncope and weakness.  All other systems reviewed and are negative.    Physical Exam Updated Vital Signs BP 123/70   Pulse 66   Temp 98.2 F (36.8 C) (Oral)   Resp 18   Ht 5\' 6"  (1.676 m)   Wt 220 lb (99.8 kg)   SpO2 98%   BMI 35.51 kg/m   Physical Exam  Constitutional: She appears well-developed and well-nourished. No distress.  HENT:  Head: Normocephalic and atraumatic.  Eyes: Conjunctivae are normal.  Neck: Neck supple.  Cardiovascular: Normal rate and regular rhythm.   No murmur heard. Pulmonary/Chest: Effort normal and breath sounds normal. No respiratory distress.  Abdominal: Soft. There is no tenderness.  Musculoskeletal: She exhibits tenderness. She exhibits no edema.  Patient has tenderness midline and paraspinal  on the right lower lumbar.  Neurological: She is alert.  Reflex Scores:      Patellar reflexes are 2+ on the right side and 2+ on the left side.      Achilles reflexes are 2+ on the right side and 2+ on the left side. Patient has 5+ strength with flexion and extension of great toe and ankle bilateral. Patient has 5+ strength with flexion-extension of knees bilateral. Difficulty with flexion-extension of the right hip due to pain in the back. Mild positive straight leg raise on the right.  Skin: Skin is warm and dry.  Psychiatric: She has a normal mood and affect.  Nursing note and  vitals reviewed.    ED Treatments / Results  Labs (all labs ordered are listed, but only abnormal results are displayed) Labs Reviewed - No data to display  EKG  EKG Interpretation None       Radiology No results found.  Procedures Procedures (including critical care time)  Medications Ordered in ED Medications  HYDROmorphone (DILAUDID) injection 1 mg (1 mg Intramuscular Given 09/15/16 2245)  diazepam (VALIUM) tablet 10 mg (10 mg Oral Given 09/15/16 2245)  acetaminophen (TYLENOL) tablet 1,000 mg (1,000 mg Oral Given 09/15/16 2245)     Initial Impression / Assessment and Plan / ED Course  I have reviewed the triage vital signs and the nursing notes.  Pertinent labs & imaging results that were available during my care of the patient were reviewed by me and considered in my medical decision making (see chart for details).  Clinical Course    Patient presents with right-sided sciatica symptoms. Patient has mild midline tenderness on exam no direct trauma as a twisting incident. Plan for screening x-rays, pain meds, muscle relaxant and reassessment. No obvious weakness on exam majority his pain related however discussed specific reasons for follow-up and reasons to return. Patient improved on reassessment. Normal strength with right hip flexion. Discussed outpatient follow-up. Final Clinical Impressions(s) / ED Diagnoses   Final diagnoses:  Acute back pain with sciatica, right    New Prescriptions New Prescriptions   CYCLOBENZAPRINE (FLEXERIL) 10 MG TABLET    Take 1 tablet (10 mg total) by mouth 2 (two) times daily as needed for muscle spasms.   HYDROCODONE-ACETAMINOPHEN (NORCO) 5-325 MG TABLET    Take 2 tablets by mouth every 6 (six) hours as needed.     Blane OharaJoshua Raihana Balderrama, MD 09/15/16 [phone]

## 2016-09-15 NOTE — ED Triage Notes (Signed)
The pt is c/o of back pain after she attempted to stop a bottle of wine from falling at work.  She [facility] her [facility] and [facility] she was [facility] here for a mri   She has pain in her lower back numbness in rt foot and leg  lmp none

## 2016-09-15 NOTE — Discharge Instructions (Signed)
Used muscle relaxants and heating pad as needed. Use Tylenol and ice as needed for pain, For severe pain take norco or vicodin however realize they have the potential for addiction and it can make you sleepy and has tylenol in it.  No operating machinery while taking. Follow-up with a local physician for reassessment and further workup if you do not improve or you worsen which may include urinary incontinence/ retention, weakness or uncontrolled pain.

## 2016-09-15 NOTE — ED Provider Notes (Signed)
[facility]    CSN: [id number] Arrival date & time: 09/15/16  1808     History   Chief Complaint Chief Complaint  Patient presents with  . Back Pain    HPI Veronica Lawrence is a 40 y.o. female.   HPI  She is a 40 year old woman here for evaluation of back pain and right-sided sciatica. She was at work Geophysicist/field seismologisttoday stocking shelves. She was on her hands and knees and had to rotate suddenly to catch wine bottles before they fell off. She developed sudden pain in her back and shooting pains down her right leg.  She was unable to stand without assistance. She has developed significant numbness, primarily in the right foot almost but some in the right leg as well. No saddle anesthesia. No bowel or bladder incontinence. She states as long as she holds still, the pain isn't too bad, but any movement exacerbates the pain significantly. She has difficulty moving the right leg, which she attributes to both pain and weakness.  She has a history of 2 laminectomies in the L5-S1 area. [place]  Past Medical History:  Diagnosis Date  . Anxiety   . Depression   . Fatigue   . Headache     Patient Active Problem List   Diagnosis Date Noted  . Major depressive disorder, single episode, moderate (HCC) 04/26/2015  . LBP (low back pain) 07/16/2013  . Acid reflux 11/17/2012    Past Surgical History:  Procedure Laterality Date  . BACK SURGERY  August 2015  . BACK SURGERY  October 2014    OB History    No data available       Home Medications    Prior to Admission medications   Medication Sig Start Date End Date Taking? Authorizing Provider  omeprazole (PRILOSEC) 20 MG capsule Take 20 mg by mouth. 06/16/15   Historical Provider, MD    Family History Family History  Problem Relation Age of Onset  . Dementia Maternal Grandmother   . Bipolar disorder Maternal Grandmother     Social History Social History  Substance Use Topics  . Smoking  status: Never Smoker  . Smokeless tobacco: Never Used  . Alcohol use 0.6 - 1.2 oz/week    1 - 2 Cans of beer per week     Allergies   Nsaids   Review of Systems Review of Systems As in history of present illness  Physical Exam Triage Vital Signs ED Triage Vitals  Enc Vitals Group     BP 09/15/16 1840 142/86     Pulse Rate 09/15/16 1840 77     Resp 09/15/16 1840 18     Temp 09/15/16 1840 98.2 F (36.8 C)     Temp src --      SpO2 09/15/16 1840 100 %     Weight --      Height --      Head Circumference --      Peak Flow --      Pain Score 09/15/16 1842 3     Pain Loc --      Pain Edu? --      Excl. in GC? --    No data found.   Updated Vital Signs BP 142/86   Pulse 77   Temp 98.2 F (36.8 C)   Resp 18   SpO2 100%   Visual Acuity Right Eye Distance:   Left Eye Distance:   Bilateral Distance:  Right Eye Near:   Left Eye Near:    Bilateral Near:     Physical Exam  Constitutional: She is oriented to person, place, and time. She appears well-developed and well-nourished. She appears distressed.  Cardiovascular: Normal rate.   Pulmonary/Chest: Effort normal.  Musculoskeletal:  Back: Exam limited due to pain with any sort of movement. Positive straight leg raise on the right. Patellar and Achilles reflexes intact. She is unable to flex her hip.  She has decent strength in her quadriceps. She does have weakness with dorsiflexion of her foot.  Neurological: She is alert and oriented to person, place, and time.     UC Treatments / Results  Labs (all labs ordered are listed, but only abnormal results are displayed) Labs Reviewed - No data to display  EKG  EKG Interpretation None       Radiology No results found.  Procedures Procedures (including critical care time)  Medications Ordered in UC Medications  HYDROcodone-acetaminophen (NORCO/VICODIN) 5-325 MG per tablet 2 tablet (2 tablets Oral Given 09/15/16 1936)     Initial Impression /  Assessment and Plan / UC Course  I have reviewed the triage vital signs and the nursing notes.  Pertinent labs & imaging results that were available during my care of the patient were reviewed by me and considered in my medical decision making (see chart for details).  Clinical Course     Concern for acute disc herniation given presentation with some weakness and significant numbness. [facility] via private vehicle for MRI evaluation given her history and presentation.  Final Clinical Impressions(s) / UC Diagnoses   Final diagnoses:  Acute right-sided back pain with sciatica    New Prescriptions Discharge Medication List as of 09/15/2016  7:57 PM       Charm RingsErin J Dorla Guizar, MD 09/15/16 1958

## 2016-09-16 NOTE — ED Notes (Signed)
This RN wasted 1mg  dilaudid with Lewie LoronEmilie, RN @0258  on 09/16/16. RN unable to waste in pyxis, pt name unable to be found.

## 2016-09-17 ENCOUNTER — Ambulatory Visit
Admission: RE | Admit: 2016-09-17 | Discharge: 2016-09-17 | Disposition: A | Discharge status: Home / Self Care | Payer: Worker's Compensation | Source: Ambulatory Visit | Attending: Family Medicine | Admitting: Family Medicine

## 2016-09-17 ENCOUNTER — Other Ambulatory Visit: Payer: Self-pay | Admitting: Family Medicine

## 2016-09-17 DIAGNOSIS — M549 Dorsalgia, unspecified: Secondary | ICD-10-CM

## 2016-09-23 ENCOUNTER — Ambulatory Visit (HOSPITAL_COMMUNITY)
Admission: EM | Admit: 2016-09-23 | Discharge: 2016-09-23 | Disposition: A | Discharge status: Home / Self Care | Payer: Worker's Compensation

## 2017-06-03 ENCOUNTER — Encounter (HOSPITAL_COMMUNITY): Payer: Self-pay | Admitting: Emergency Medicine

## 2017-06-03 ENCOUNTER — Emergency Department (HOSPITAL_COMMUNITY)
Admission: EM | Admit: 2017-06-03 | Discharge: 2017-06-04 | Disposition: A | Discharge status: Home / Self Care | Payer: BLUE CROSS/BLUE SHIELD | Attending: Emergency Medicine | Admitting: Emergency Medicine

## 2017-06-03 ENCOUNTER — Emergency Department (HOSPITAL_COMMUNITY): Payer: BLUE CROSS/BLUE SHIELD

## 2017-06-03 DIAGNOSIS — M5441 Lumbago with sciatica, right side: Secondary | ICD-10-CM

## 2017-06-03 DIAGNOSIS — Z79899 Other long term (current) drug therapy: Secondary | ICD-10-CM | POA: Insufficient documentation

## 2017-06-03 DIAGNOSIS — M21371 Foot drop, right foot: Secondary | ICD-10-CM | POA: Diagnosis not present

## 2017-06-03 DIAGNOSIS — M519 Unspecified thoracic, thoracolumbar and lumbosacral intervertebral disc disorder: Secondary | ICD-10-CM | POA: Diagnosis not present

## 2017-06-03 DIAGNOSIS — M545 Low back pain: Secondary | ICD-10-CM | POA: Diagnosis present

## 2017-06-03 MED ORDER — HYDROCODONE-ACETAMINOPHEN 5-325 MG PO TABS
1.0000 | ORAL_TABLET | Freq: Once | ORAL | Status: AC
  Administered 2017-06-03: 1 via ORAL
  Filled 2017-06-03: qty 1

## 2017-06-03 MED ORDER — DEXAMETHASONE SODIUM PHOSPHATE 10 MG/ML IJ SOLN
10.0000 mg | Freq: Once | INTRAMUSCULAR | Status: AC
  Administered 2017-06-03: 10 mg via INTRAMUSCULAR
  Filled 2017-06-03: qty 1

## 2017-06-03 NOTE — ED Notes (Signed)
Patient transported to MRI 

## 2017-06-03 NOTE — ED Provider Notes (Signed)
[facility] Provider Note   CSN: [id number] Arrival date & time: 06/03/17  1650  By signing my name below, I, Vista Mink, attest that this documentation has been prepared under the direction and in the presence of Lyndel Safe PA-C.  Electronically Signed: Vista Mink, ED Scribe. 06/03/17. 7:30 PM.  History   Chief Complaint Chief Complaint  Patient presents with  . Back Pain    HPI HPI Comments: Veronica Lawrence is a 41 y.o. female with Hx of L5, S1 laminectomies one year ago, who presents to the Emergency Department complaining of sudden onset low back pain that started s/p an injury that occurred last night. 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 Last night she reached over one box to pick up another and had sudden onset pain when lifting the box. The pain has been gradually worsening since onset. She took a Gabapentin without significant relief of pain. She also notes associated tingling in her right leg which is intermittent, worst on the bottom of her right foot. Pt also notes weakness in her right lower extremity which is new since hurting her back last night. She reports that it feels like her right foot is heavy. Pt has been able to ambulate but with increased difficulty due to pain. No Hx of IV drug use. No Hx of cancer. No changes in bowel or bladder incontinence. No fever, chills.  She reports that she has a IUD in place and therefore is no possibility of being pregnant.  The history is provided by the patient. No language interpreter was used.    Past Medical History:  Diagnosis Date  . Anxiety   . Depression   . Fatigue   . Headache     Patient Active Problem List   Diagnosis Date Noted  . Major depressive disorder, single episode, moderate (HCC) 04/26/2015  . LBP (low back pain) 07/16/2013  . Acid reflux 11/17/2012    Past Surgical History:  Procedure Laterality Date  . BACK SURGERY  August 2015  . BACK SURGERY  October 2014     OB History    No data available       Home Medications    Prior to Admission medications   Medication Sig Start Date End Date Taking? Authorizing Provider  omeprazole (PRILOSEC) 20 MG capsule Take 20 mg by mouth daily.  06/16/15  Yes [provider]  propranolol (INDERAL) 10 MG tablet Take 10 mg by mouth daily as needed for anxiety. 06/02/17  Yes [provider]  venlafaxine XR (EFFEXOR-XR) 75 MG 24 hr capsule Take 75 mg by mouth daily. 06/02/17  Yes [provider]  cyclobenzaprine (FLEXERIL) 10 MG tablet Take 1 tablet (10 mg total) by mouth 2 (two) times daily as needed for muscle spasms. Patient not taking: Reported on 06/03/2017 09/15/16   Blane Ohara, MD  HYDROcodone-acetaminophen (NORCO/VICODIN) 5-325 MG tablet Take 1-2 tablets by mouth every 6 (six) hours as needed. 06/04/17   Cristina Gong, PA-C  methylPREDNISolone (MEDROL DOSEPAK) 4 MG TBPK tablet Take all tablets at breakfast with food. Day one: take 6 tablets Day two: take 5 tablets Day three: take 4 tablets Day four:take 3 tablets Day five: take two tablets Day six: take one tablet 06/04/17   Cristina Gong, PA-C    Family History Family History  Problem Relation Age of Onset  . Dementia Maternal Grandmother   . Bipolar disorder Maternal Grandmother     Social History Social History  Substance Use Topics  . Smoking status: Never Smoker  . Smokeless tobacco: Never Used  . Alcohol use 0.6 - 1.2 oz/week    1 - 2 Cans of beer per week     Allergies   Nsaids   Review of Systems Review of Systems  Constitutional: Negative for chills, fatigue and fever.  Eyes: Negative for visual disturbance.  Respiratory: Negative for chest tightness and shortness of breath.   Genitourinary: Negative for decreased urine volume, difficulty urinating, dysuria, frequency, hematuria and urgency.  Musculoskeletal: Positive for back pain, gait problem (due to pain) and myalgias. Negative for  joint swelling, neck pain and neck stiffness.  Skin: Negative for color change and wound.  Neurological: Positive for weakness (R leg). Negative for dizziness, facial asymmetry, speech difficulty, light-headedness, numbness and headaches.  All other systems reviewed and are negative.    Physical Exam Updated Vital Signs BP 131/88   Pulse 71   Temp 98.5 F (36.9 C) (Oral)   Resp 18   Ht 5\' 6"  (1.676 m)   Wt 116.1 kg (255 lb 14.4 oz)   SpO2 96%   BMI 41.30 kg/m   Physical Exam  Constitutional: She is oriented to person, place, and time. She appears well-developed and well-nourished. No distress.  HENT:  Head: Normocephalic and atraumatic.  Neck: Normal range of motion.  Pulmonary/Chest: Effort normal.  Musculoskeletal:  Patient is tender to palpation lumbar midline. She has mild lumbar right-sided paraspinal muscle tenderness however is more tender in midline.Remainder of spine is nontender, nonpainful.    She has 5 out of 5 strength left ankle plantar and dorsiflexion, knee and hip flexion/extension bilaterally was unable to be tested secondary to pain.   4/5 strength in right ankle through dorsi and plantar flexion.  Decreased sensation over right foot.   Bilateral upper and lower extremities are warm and well perfused.  Neurological: She is alert and oriented to person, place, and time.  Reflex Scores:      Patellar reflexes are 2+ on the right side and 2+ on the left side. Mental Status:  Alert, oriented, thought content appropriate, able to give a coherent history. Speech fluent without evidence of aphasia. Able to follow 2 step commands without difficulty.  Cranial Nerves:  II:  Peripheral visual fields grossly normal, pupils equal, round, reactive to light III,IV, VI: ptosis not present, extra-ocular motions intact bilaterally  V,VII: smile symmetric, facial light touch sensation equal VIII: hearing grossly normal to voice  X: uvula elevates symmetrically  XI:  bilateral shoulder shrug symmetric and strong XII: midline tongue extension without fassiculations Motor: Decreased strength right ankle dorsi and plantar flexion. Right foot has paresthesias. No other numbness/tingling reported. No numbness/tingling to upper inner thighs or genitals. CV: distal pulses palpable throughout    Skin: Skin is warm and dry. She is not diaphoretic.  Psychiatric: She has a normal mood and affect. Judgment normal.  Nursing note and vitals reviewed.    ED Treatments / Results  DIAGNOSTIC STUDIES: Oxygen Saturation is 100% on RA, normal by my interpretation.  COORDINATION OF CARE: 7:30 PM-Discussed treatment plan with pt at bedside and pt agreed to plan.   Labs (all labs ordered are listed, but only abnormal results are displayed) Labs Reviewed - No data to display  EKG  EKG Interpretation None       Radiology Mr Lumbar Spine Wo Contrast  Result Date: 06/03/2017 CLINICAL DATA:  41 y/o F; history of L5 and S1 laminectomies 1 year ago.  Sudden onset lower back pain radiating into the right leg with numbness and tingling. EXAM: MRI LUMBAR SPINE WITHOUT CONTRAST TECHNIQUE: Multiplanar, multisequence MR imaging of the lumbar spine was performed. No intravenous contrast was administered. COMPARISON:  09/17/2016 lumbar spine radiographs. FINDINGS: Segmentation:  Standard. Alignment:  Minimal L5-S1 retrolisthesis. Vertebrae: Mild degenerative L5-S1 endplate edema. Trace facet effusions at L4-5 and L5-S1 bilaterally. Conus medullaris: Extends to the L2 level and appears normal. Paraspinal and other soft tissues: Postsurgical changes in paraspinal muscles and subcutaneous fat related to L5-S1 laminectomy. Disc levels: L1-2: No significant disc displacement, foraminal narrowing, or canal stenosis. L2-3: Minimal disc bulge. No significant foraminal or canal stenosis. L3-4: Minimal disc bulge. No significant foraminal or canal stenosis. L4-5: Small disc bulge and mild facet  hypertrophy. No significant foraminal or canal stenosis. L5-S1: Disc bulge and mild facet hypertrophy. Large right central to subarticular disc protrusion measuring 10 x 10 mm in sagittal plane with impingement of descending right S1 nerve root in the lateral recess (series 4, image 5). Mild right foraminal stenosis. No significant canal stenosis. IMPRESSION: 1. Large right central to subarticular L5-S1 disc protrusion with right lateral recess effacement and impingement of descending right S1 nerve root. 2. Postsurgical changes related L5-S1 laminectomy. 3. Otherwise no high-grade foraminal or canal stenosis. Electronically Signed   By: Mitzi Hansen M.D.   On: 06/03/2017 22:03    Procedures Procedures (including critical care time)  Medications Ordered in ED Medications  dexamethasone (DECADRON) injection 10 mg (10 mg Intramuscular Given 06/03/17 2036)  HYDROcodone-acetaminophen (NORCO/VICODIN) 5-325 MG per tablet 1 tablet (1 tablet Oral Given 06/03/17 2036)  HYDROcodone-acetaminophen (NORCO/VICODIN) 5-325 MG per tablet 1 tablet (1 tablet Oral Given 06/03/17 2259)     Initial Impression / Assessment and Plan / ED Course  I have reviewed the triage vital signs and the nursing notes.  Pertinent labs & imaging results that were available during my care of the patient were reviewed by me and considered in my medical decision making (see chart for details).  Clinical Course as of Jun 04 50  Tue Jun 03, 2017  2350 Rechecked patient, she states that the second Norco took the edge off her pain and it is now down to a 5 or a 6  [EH]  Wed Jun 04, 2017  0002 Neurosurgery re-consulted.    [EH]  0028 Spoke with neurosurgery PA.  Recommend Medrol dose pack, he will have their office contact her for sooner appointment.   [EH]    Clinical Course User Index [EH] Cristina Gong, PA-C   Veronica Lawrence presents with one day of acute exacerbation of chronic back pain that occurred after she  lifted a box with poor technique. She has decreased sensation over the sole of her right foot along with weakness with dorsiflexion of right ankle.  She does not have any saddle paresthesias or changes to bowel or bladder function. Based on symptoms and pain MR lumbar spine was ordered which showed a 10 x 10 mm disk protrusion on the right S1 nerve consistent with her symptoms.  Her pain was controlled in the emergency room with Norco and she was treated with 10 mg dexamethasone IM prior to the MRI being obtained. Neurosurgery was consulted for recommendations. Neurosurgery recommended patient be given a Medrol Dosepak, and pain control. They report that their office will attempt to contact patient to arrange a timely follow-up appointment.    Patient's diagnosis, and follow-up plan were discussed with patient who states  her understanding. She was given the option to ask questions, all of which ventricular the best of my abilities.  At time of discharge patient hemodynamically stable, pain has been reduced while in the emergency room. Patient was advised not to drive or operate heavy machinery while taking Norco and while her right ankle weakness and foot numbness persist. Patient states her understanding of these instructions and that she will comply with them.   At this time there does not appear to be any evidence of an acute emergency medical condition and the patient appears stable for discharge with appropriate outpatient follow up.Diagnosis was discussed with patient who verbalizes understanding and is agreeable to discharge. Pt case discussed with Dr. Clarene DukeLittle who agrees with my plan.     Final Clinical Impressions(s) / ED Diagnoses   Final diagnoses:  Acute midline low back pain with right-sided sciatica  Right foot drop  Ruptured disk    New Prescriptions New Prescriptions   HYDROCODONE-ACETAMINOPHEN (NORCO/VICODIN) 5-325 MG TABLET    Take 1-2 tablets by mouth every 6 (six) hours as needed.     METHYLPREDNISOLONE (MEDROL DOSEPAK) 4 MG TBPK TABLET    Take all tablets at breakfast with food. Day one: take 6 tablets Day two: take 5 tablets Day three: take 4 tablets Day four:take 3 tablets Day five: take two tablets Day six: take one tablet  I personally performed the services described in this documentation, which was scribed in my presence. The recorded information has been reviewed and is accurate.     Cristina GongHammond, Elizabeth W, PA-C 06/04/17 0059    Clarene DukeLittle, Ambrose Finlandachel Morgan, MD 06/06/17 [phone]

## 2017-06-03 NOTE — ED Triage Notes (Signed)
Pt c/o sudden onset low back pain onset last night after twisting back while lifting box, radiating right down leg. Did not feel or hear any popping. Ambulancy. No bowel or bladder changes. Hx L5 and S1 laminectomies.

## 2017-06-04 MED ORDER — METHYLPREDNISOLONE 4 MG PO TBPK: ORAL_TABLET | ORAL | 0 refills | Status: DC

## 2017-06-04 MED ORDER — HYDROCODONE-ACETAMINOPHEN 5-325 MG PO TABS: 1.0000 | ORAL_TABLET | Freq: Four times a day (QID) | ORAL | 0 refills | Status: DC | PRN

## 2017-06-04 NOTE — Discharge Instructions (Signed)
Today you received medications that may make you sleepy or impair your ability to make decisions.  For the next 24 hours please do not drive, operate heavy machinery, care for a small child with out another adult present, or perform any activities that may cause harm to you or someone else if you were to fall asleep or be impaired.   You are being prescribed a medication which may make you sleepy. Please follow up of listed precautions for at least 24 hours after taking one dose.  Please do not drive until your weakness and numbness fully goes away or you are cleared to do so by a physician.

## 2017-06-19 ENCOUNTER — Emergency Department (HOSPITAL_COMMUNITY): Payer: BLUE CROSS/BLUE SHIELD

## 2017-06-19 ENCOUNTER — Encounter (HOSPITAL_COMMUNITY): Payer: Self-pay | Admitting: Emergency Medicine

## 2017-06-19 ENCOUNTER — Emergency Department (HOSPITAL_COMMUNITY)
Admission: EM | Admit: 2017-06-19 | Discharge: 2017-06-20 | Disposition: A | Discharge status: Home / Self Care | Payer: BLUE CROSS/BLUE SHIELD | Attending: Emergency Medicine | Admitting: Emergency Medicine

## 2017-06-19 DIAGNOSIS — R262 Difficulty in walking, not elsewhere classified: Secondary | ICD-10-CM | POA: Diagnosis not present

## 2017-06-19 DIAGNOSIS — R200 Anesthesia of skin: Secondary | ICD-10-CM | POA: Diagnosis present

## 2017-06-19 DIAGNOSIS — M5441 Lumbago with sciatica, right side: Secondary | ICD-10-CM | POA: Diagnosis not present

## 2017-06-19 DIAGNOSIS — M629 Disorder of muscle, unspecified: Secondary | ICD-10-CM | POA: Diagnosis not present

## 2017-06-19 DIAGNOSIS — M545 Low back pain: Secondary | ICD-10-CM | POA: Diagnosis not present

## 2017-06-19 DIAGNOSIS — R32 Unspecified urinary incontinence: Secondary | ICD-10-CM

## 2017-06-19 DIAGNOSIS — R531 Weakness: Secondary | ICD-10-CM

## 2017-06-19 DIAGNOSIS — Z79899 Other long term (current) drug therapy: Secondary | ICD-10-CM | POA: Diagnosis not present

## 2017-06-19 LAB — COMPREHENSIVE METABOLIC PANEL
ALT: 12 U/L — ABNORMAL LOW (ref 14–54)
AST: 47 U/L — ABNORMAL HIGH (ref 15–41)
Albumin: 3.6 g/dL (ref 3.5–5.0)
Alkaline Phosphatase: 60 U/L (ref 38–126)
Anion gap: 9 (ref 5–15)
BUN: 6 mg/dL (ref 6–20)
CO2: 21 mmol/L — ABNORMAL LOW (ref 22–32)
Calcium: 8.2 mg/dL — ABNORMAL LOW (ref 8.9–10.3)
Chloride: 105 mmol/L (ref 101–111)
Creatinine, Ser: 0.75 mg/dL (ref 0.44–1.00)
GFR calc Af Amer: >60 mL/min (ref >60)
GFR calc non Af Amer: >60 mL/min (ref >60)
Glucose, Bld: 89 mg/dL (ref 65–99)
Potassium: 5.4 mmol/L — ABNORMAL HIGH (ref 3.5–5.1)
Sodium: 135 mmol/L (ref 135–145)
Total Bilirubin: 1.6 mg/dL — ABNORMAL HIGH (ref 0.3–1.2)
Total Protein: 6.5 g/dL (ref 6.5–8.1)

## 2017-06-19 LAB — CBC
HCT: 35.9 % — ABNORMAL LOW (ref 36.0–46.0)
Hemoglobin: 11.5 g/dL — ABNORMAL LOW (ref 12.0–15.0)
MCH: 25.8 pg — ABNORMAL LOW (ref 26.0–34.0)
MCHC: 32 g/dL (ref 30.0–36.0)
MCV: 80.7 fL (ref 78.0–100.0)
PLATELETS: 385 10*3/uL (ref 150–400)
RBC: 4.45 MIL/uL (ref 3.87–5.11)
RDW: 16.7 % — AB (ref 11.5–15.5)
WBC: 9 10*3/uL (ref 4.0–10.5)

## 2017-06-19 LAB — I-STAT BETA HCG BLOOD, ED (MC, WL, AP ONLY): I-stat hCG, quantitative: <5 m[IU]/mL (ref <5)

## 2017-06-19 LAB — PROTIME-INR
INR: 1.08
PROTHROMBIN TIME: 14.1 s (ref 11.4–15.2)

## 2017-06-19 MED ORDER — OXYCODONE-ACETAMINOPHEN 5-325 MG PO TABS
1.0000 | ORAL_TABLET | Freq: Once | ORAL | Status: AC
  Administered 2017-06-19: 1 via ORAL
  Filled 2017-06-19: qty 1

## 2017-06-19 NOTE — Progress Notes (Signed)
Asked to review MRI L Spine. Reviewed with Dr Conchita Paris. She does have a known disc bulge at L4-5 that appears to be fairly stable. Displacement of right L5 and S1 nerves. Minimal central stenosis. These findings would not explain urinary incontinence.  Can be admitted for pain control Rec being NPO at midnight Will discuss case with Dr Newell Coral tomorrow.  No emergent NS intervention.

## 2017-06-19 NOTE — ED Notes (Signed)
Recollected light green top for cmp test.

## 2017-06-19 NOTE — ED Notes (Signed)
Patient transported to MRI 

## 2017-06-19 NOTE — ED Triage Notes (Signed)
Patient arrives from home with complaint of bilateral lower extremity weakness and numbness. Also states she was incontinent of urine earlier after the numbness set in. History of lower back surgery x2. Another surgery is scheduled for Wednesday of this coming week.

## 2017-06-19 NOTE — ED Provider Notes (Signed)
[facility]-EMERGENCY DEPT Provider Note   CSN: [id number] Arrival date & time: 06/19/17  1924     History   Chief Complaint Chief Complaint  Patient presents with  . Numbness  . Extremity Weakness    HPI Veronica Lawrence is a 41 y.o. female with history of anxiety, depression, and L5/S1 laminectomies 1 year ago who presents today with chief complaint acute onset, constant numbness of the right lower extremity and urinary incontinence which began earlier today. Patient states that she has been experiencing low back pain for several months, with acute worsening over the past 2 or 3 days. She states that today at around 5 PM she experienced sudden onset numbness of the bilateral lower extremitis radiating to the toes, R>L. During this time she also experienced an episode of urinary incontinence. She does endorse saddle anesthesia. Back pain is localized to the low back and is aching with sharp pain in the right hip. She states that prior to this she only had mild intermittent tingling of her right lower extremity.  She denies stool incontinence, fevers, chills, IV drug use, or history of cancer. She does state that she has had 6 beers to try to treat her pain. She is scheduled for L5-S1 laminectomy next Wednesday in 6 days with Dr. Newell Coral. Last MRI showed large right central to subarticular L5/S1 disc protrusion with right lateral recess effacement and impingement of descending right S1 nerve root.   The history is provided by the patient.    Past Medical History:  Diagnosis Date  . Anxiety   . Depression   . Fatigue   . Headache     Patient Active Problem List   Diagnosis Date Noted  . Major depressive disorder, single episode, moderate (HCC) 04/26/2015  . LBP (low back pain) 07/16/2013  . Acid reflux 11/17/2012    Past Surgical History:  Procedure Laterality Date  . BACK SURGERY  August 2015  . BACK SURGERY  October 2014    OB History    No data available       Home  Medications    Prior to Admission medications   Medication Sig Start Date End Date Taking? Authorizing Provider  gabapentin (NEURONTIN) 300 MG capsule Take 300 mg by mouth 3 (three) times daily.   Yes [provider]  omeprazole (PRILOSEC) 20 MG capsule Take 20 mg by mouth daily.  06/16/15  Yes [provider]  propranolol (INDERAL) 10 MG tablet Take 10 mg by mouth daily as needed for anxiety. 06/02/17  Yes [provider]  venlafaxine XR (EFFEXOR-XR) 75 MG 24 hr capsule Take 75 mg by mouth daily. 06/02/17  Yes [provider]  cyclobenzaprine (FLEXERIL) 10 MG tablet Take 1 tablet (10 mg total) by mouth 2 (two) times daily as needed for muscle spasms. Patient not taking: Reported on 06/03/2017 09/15/16   Blane Ohara, MD  HYDROcodone-acetaminophen (NORCO/VICODIN) 5-325 MG tablet Take 1-2 tablets by mouth every 6 (six) hours as needed. Patient not taking: Reported on 06/19/2017 06/04/17   Cristina Gong, PA-C  methylPREDNISolone (MEDROL DOSEPAK) 4 MG TBPK tablet Take all tablets at breakfast with food. Day one: take 6 tablets Day two: take 5 tablets Day three: take 4 tablets Day four:take 3 tablets Day five: take two tablets Day six: take one tablet Patient not taking: Reported on 06/19/2017 06/04/17   Cristina Gong, PA-C    Family History Family History  Problem Relation Age of Onset  . Dementia Maternal Grandmother   .  Bipolar disorder Maternal Grandmother     Social History Social History  Substance Use Topics  . Smoking status: Never Smoker  . Smokeless tobacco: Never Used  . Alcohol use 0.6 - 1.2 oz/week    1 - 2 Cans of beer per week     Allergies   Nsaids   Review of Systems Review of Systems  Constitutional: Negative for chills and fever.  Eyes: Negative for visual disturbance.  Respiratory: Negative for shortness of breath.   Cardiovascular: Negative for chest pain.  Gastrointestinal: Negative for abdominal pain,  constipation, diarrhea, nausea and vomiting.  Genitourinary:       Bladder incontinence + Bowel incontinence -  Musculoskeletal: Positive for back pain and gait problem.  Neurological: Positive for weakness and numbness. Negative for syncope and headaches.  All other systems reviewed and are negative.    Physical Exam Updated Vital Signs BP 126/78   Pulse 61   Temp 98.7 F (37.1 C) (Oral)   Resp 12   SpO2 100%   Physical Exam  Constitutional: She is oriented to person, place, and time. She appears well-developed and well-nourished. No distress.  HENT:  Head: Normocephalic and atraumatic.  Right Ear: External ear normal.  Left Ear: External ear normal.  Mouth/Throat: Oropharynx is clear and moist.  Eyes: Pupils are equal, round, and reactive to light. Conjunctivae and EOM are normal. Right eye exhibits no discharge. Left eye exhibits no discharge.  Neck: Normal range of motion. Neck supple. No JVD present. No tracheal deviation present.  Cardiovascular: Normal rate, regular rhythm, normal heart sounds and intact distal pulses.   2+ radial and DP/PT pulses bl, negative Homan's bl   Pulmonary/Chest: Effort normal and breath sounds normal. No respiratory distress. She has no wheezes. She has no rales.  Abdominal: Soft. Bowel sounds are normal. She exhibits no distension. There is no tenderness.  Genitourinary:  Genitourinary Comments: Examination performed in the presence of a chaperone. Good rectal tone. No fissures or tears or frank bleeding.  Musculoskeletal: She exhibits tenderness. She exhibits no edema.  Midline L5/S1 spinal process TTP. Bilateral paraspinal muscle tenderness in this region. Bilateral SI joint tenderness. Straight leg raise positive on the right. No deformity, crepitus, swelling, or step-off noted. No ecchymosis noted.  Neurological: She is alert and oriented to person, place, and time. She displays no tremor. A sensory deficit is present. No cranial nerve  deficit. She exhibits abnormal muscle tone. She displays no Babinski's sign on the right side. She displays no Babinski's sign on the left side.  Reflex Scores:      Patellar reflexes are 1+ on the right side and 2+ on the left side.      Achilles reflexes are 1+ on the right side and 2+ on the left side. Mental Status:  Alert, thought content appropriate, able to give a coherent history. Speech fluent without evidence of aphasia. Able to follow 2 step commands without difficulty.  Cranial Nerves:  II:  Peripheral visual fields grossly normal, pupils equal, round, reactive to light III,IV, VI: ptosis not present, extra-ocular motions intact bilaterally  V,VII: smile symmetric, facial light touch sensation equal VIII: hearing grossly normal to voice  X: uvula elevates symmetrically  XI: bilateral shoulder shrug symmetric and strong XII: midline tongue extension without fassiculations Motor:  5/5 strength of BUE major muscle groups including strong and equal grip strength and dorsiflexion/plantar flexion. 3/5 strength of R hip flexors R gastroc, and R EHL. 5/5 strength of LLE major muscle groups.  Patient able to lift heel on the left but not the right. Sensory: Altered sensation of numbness to the right lateral lower extremity into the toes. Left lower extremity with normal sensation except to the toes and lateral calf. Numbness is worse on the right Cerebellar: normal finger-to-nose with bilateral upper extremities Gait: Able to lift herself up from a seated position to a standing position. Unable to flex her right hip or left leg, swings leg however in an attempt to walk but unable to ambulate. CV: 2+ radial and DP/PT pulses   Skin: Skin is warm and dry. No erythema.  Psychiatric: She has a normal mood and affect. Her behavior is normal.  Nursing note and vitals reviewed.    ED Treatments / Results  Labs (all labs ordered are listed, but only abnormal results are displayed) Labs  Reviewed  CBC - Abnormal; Notable for the following:       Result Value   Hemoglobin 11.5 (*)    HCT 35.9 (*)    MCH 25.8 (*)    RDW 16.7 (*)    All other components within normal limits  COMPREHENSIVE METABOLIC PANEL - Abnormal; Notable for the following:    Potassium 5.4 (*)    CO2 21 (*)    Calcium 8.2 (*)    AST 47 (*)    ALT 12 (*)    Total Bilirubin 1.6 (*)    All other components within normal limits  PROTIME-INR  I-STAT CHEM 8, ED  I-STAT BETA HCG BLOOD, ED ([facility], [facility], [facility] ONLY)    EKG  EKG Interpretation None       Radiology Mr Lumbar Spine Wo Contrast  Result Date: 06/19/2017 CLINICAL DATA:  Back pain, urinary incontinence, lower extremity weakness and numbness. Assess cauda equina syndrome. History of back surgeries. EXAM: MRI LUMBAR SPINE WITHOUT CONTRAST TECHNIQUE: Multiplanar, multisequence MR imaging of the lumbar spine was performed. No intravenous contrast was administered. COMPARISON:  MRI of the lumbar spine June 03, 2017 FINDINGS: SEGMENTATION: For the purposes of this report, the last well-formed intervertebral disc will be reported as L5-S1. ALIGNMENT: Maintained lumbar lordosis. No malalignment. VERTEBRAE:Vertebral bodies are intact. Moderate to severe similar L5-S1 disc height loss with associated mild acute on chronic discogenic endplate changes. Mild disc desiccation L4-5. Old an acute Schmorl's nodes. CONUS MEDULLARIS: Conus medullaris terminates at L1-2 and demonstrates normal morphology and signal characteristics. Cauda equina is normal. PARASPINAL AND SOFT TISSUES: Included prevertebral and paraspinal soft tissues are will not acute. Disc density urinary bladder, recommend correlation voiding. DISC LEVELS: L1-2, L2-3 and L3-4: No disc bulge, canal stenosis nor neural foraminal narrowing. L4-5: Increased 4 mm broad-based disc bulge with enlarging annular fissure. Mild facet arthropathy and ligamentum flavum redundancy. 3 mm RIGHT facet synovial cyst within  paraspinal soft tissues. Mild canal stenosis with partial effacement RIGHT lateral recess which may affect the traversing RIGHT L5 nerve. Mild RIGHT neural foraminal narrowing. L5-S1: 9 x 9 mm RIGHT central to subarticular disc extrusion displacing the traversing RIGHT S1 nerve in the lateral recess, 7 mm inferior migration. Status post RIGHT laminectomy. No canal stenosis. Mild bilateral neural foraminal narrowing. IMPRESSION: 1. Enlarging L4-5 disc bulge and annular fissure may affect the traversing RIGHT L5 nerve. 2. Similar large RIGHT central to subarticular disc extrusion displacing the traversing RIGHT S1 nerve. Status post RIGHT L5 hemilaminectomy. 3. Mild canal stenosis L4-5. Mild L4-5 and L5-S1 neural foraminal narrowing. 4. Distended urinary bladder, recommend correlation with voiding. Electronically Signed   By: Pernell Dupre  Bloomer M.D.   On: 06/19/2017 22:00    Procedures Procedures (including critical care time)  Medications Ordered in ED Medications  oxyCODONE-acetaminophen (PERCOCET/ROXICET) 5-325 MG per tablet 1 tablet (1 tablet Oral Given 06/19/17 2333)     Initial Impression / Assessment and Plan / ED Course  I have reviewed the triage vital signs and the nursing notes.  Pertinent labs & imaging results that were available during my care of the patient were reviewed by me and considered in my medical decision making (see chart for details).    Patient with sudden onset bilateral lower extremity numbness and urinary incontinence with worsening low back pain. Afebrile, vital signs are stable. She has new onset right lower extremity numbness which appears to follow a radicular path. Good rectal tone. Unable to ambulate while in the ED. MRI shows enlarging L4/5 disc bulge and annular fissure which may affect the traversing right L5 nerve as well as large right central to subarticular disc extrusion displacing the traversing right S1 nerve. Also shows distended urinary bladder. Voided  after MRI and post void residual is . Spoke with Dr. Earl Gala PA Oswaldo Done who has reviewed images with Dr. Conchita Paris, who do not feel that emergent neurosurgical intervention is warranted but states that patient can be admitted for pain control. Spoke with hospitalist Dr. Antionette Char, who does not feel patient meets admission criteria. We will control her pain with percocet and consult case management for placement or outpatient resources to assist patient with ambulation leading up to her surgery next week.   1:35 AM Signed out to oncoming provider PA [place]. Awaiting case management suggestions. Neurosurgery also to round on patient in the a.m.  Final Clinical Impressions(s) / ED Diagnoses   Final diagnoses:  Urinary incontinence in female  Numbness  Weakness  Acute bilateral low back pain with right-sided sciatica    New Prescriptions New Prescriptions   No medications on file     Jeanie Sewer, PA-C 06/20/17 0135    Derwood Kaplan, MD 06/24/17 1554

## 2017-06-20 MED ORDER — PREDNISONE 20 MG PO TABS: 40.0000 mg | ORAL_TABLET | Freq: Every day | ORAL | 0 refills | Status: DC

## 2017-06-20 MED ORDER — OXYCODONE-ACETAMINOPHEN 5-325 MG PO TABS: 1.0000 | ORAL_TABLET | Freq: Four times a day (QID) | ORAL | 0 refills | Status: DC | PRN

## 2017-06-20 MED ORDER — OXYCODONE-ACETAMINOPHEN 5-325 MG PO TABS
1.0000 | ORAL_TABLET | Freq: Once | ORAL | Status: AC
  Administered 2017-06-20: 1 via ORAL
  Filled 2017-06-20: qty 1

## 2017-06-20 MED ORDER — HYDROMORPHONE HCL 1 MG/ML IJ SOLN
1.0000 mg | Freq: Once | INTRAMUSCULAR | Status: AC
  Administered 2017-06-20: 1 mg via INTRAVENOUS
  Filled 2017-06-20: qty 1

## 2017-06-20 MED ORDER — DEXAMETHASONE SODIUM PHOSPHATE 10 MG/ML IJ SOLN
10.0000 mg | Freq: Once | INTRAMUSCULAR | Status: AC
  Administered 2017-06-20: 10 mg via INTRAVENOUS
  Filled 2017-06-20: qty 1

## 2017-06-20 MED ORDER — OXYCODONE-ACETAMINOPHEN 5-325 MG PO TABS
2.0000 | ORAL_TABLET | Freq: Once | ORAL | Status: AC
  Administered 2017-06-20: 2 via ORAL
  Filled 2017-06-20: qty 2

## 2017-06-20 MED ORDER — ONDANSETRON HCL 4 MG/2ML IJ SOLN
4.0000 mg | Freq: Once | INTRAMUSCULAR | Status: AC
  Administered 2017-06-20: 4 mg via INTRAVENOUS
  Filled 2017-06-20: qty 2

## 2017-06-20 NOTE — Discharge Planning (Signed)
[facility] notes consult for SNF placement vs home with home health.  EDCM will continue to follow for disposition needs.

## 2017-06-20 NOTE — ED Provider Notes (Signed)
6:27 AM Patient holding overnight in the ED for right sided lumbar radiculopathy. Patient has had 2 previous back surgeries done by Dr. Newell Coral. She is scheduled for another surgery in 5 days. Patient has had difficulty ambulating at home, 2 episodes of urinary incontinence. During ED stay she had MRI performed showing worsening broad-based disc bulge on R side.   Pt has not had relief with #2 Percocet overnight. I have ordered decadron and IV pain medication.   Plan: Neurosurgery requested to see patient this AM. Admit if they plan to do surgery. Home with walker and symptom control if not. Discussed plan with patient and she is in agreement.   11:36 AM Patient monitored over several hours. Pain relief with IV dilaudid. She has been transitioned to oral Percocet. Case manager has been able to obtain a rolling walker for her and she has ambulated in the department. She feels comfortable with going home.   Dr. Clayborne Dana has discussed patient case with Dr. Newell Coral by telephone. They reiterated that patient has no acute indication for admission. She will follow through with plans for surgery next week.   Home with Percocet and prednisone x 4 days.   BP 122/64   Pulse 68   Temp 98.7 F (37.1 C) (Oral)   Resp 11   SpO2 99%     Renne Crigler, PA-C 06/20/17 1138    Mackuen, Cindee Salt, MD 06/26/17 2321

## 2017-06-20 NOTE — Discharge Planning (Signed)
Oletta Cohn, RN, BSN, [place] [phone] Pt qualifies for DME rolling walker.  DME  ordered through [facility].  EDCM delivered to pt room prior to D/C home.

## 2017-06-20 NOTE — Discharge Instructions (Signed)
Please read and follow all provided instructions.  Your diagnoses today include:  1. Acute bilateral low back pain with right-sided sciatica   2. Urinary incontinence in female   3. Numbness   4. Weakness     Tests performed today include:  Vital signs - see below for your results today  MRI - shops bulging discs  Medications prescribed:   Percocet (oxycodone/acetaminophen) - narcotic pain medication  DO NOT drive or perform any activities that require you to be awake and alert because this medicine can make you drowsy. BE VERY CAREFUL not to take multiple medicines containing Tylenol (also called acetaminophen). Doing so can lead to an overdose which can damage your liver and cause liver failure and possibly death.   Prednisone - steroid medicine   It is best to take this medication in the morning to prevent sleeping problems. If you are diabetic, monitor your blood sugar closely and stop taking Prednisone if blood sugar is over 300. Take with food to prevent stomach upset.   Take any prescribed medications only as directed.  Home care instructions:   Use walker at home for stability  Follow any educational materials contained in this packet  Please rest, use ice or heat on your back for the next several days  Do not lift, push, pull anything more than 10 pounds  Follow-up instructions: Please follow-up with your surgeon as planned for surgical repair.   Return instructions:  SEEK IMMEDIATE MEDICAL ATTENTION IF YOU HAVE:  New numbness, tingling, weakness, or problem with the use of your arms or legs  Severe back pain not relieved with medications  Loss control of your bowels or bladder  Increasing pain in any areas of the body (such as chest or abdominal pain)  Shortness of breath, dizziness, or fainting.   Worsening nausea (feeling sick to your stomach), vomiting, fever, or sweats  Any other emergent concerns regarding your health   Additional  Information:  Your vital signs today were: BP 122/64    Pulse 68    Temp 98.7 F (37.1 C) (Oral)    Resp 11    SpO2 99%  If your blood pressure (BP) was elevated above 135/85 this visit, please have this repeated by your doctor within one month. --------------

## 2017-06-20 NOTE — ED Provider Notes (Signed)
Patient signed out to me at shift change.  Patient unable to ambulate due to low back pain.  Having surgery next week. Plan from prior team is to get patient into a SNF and have neurosurg see patient in AM.  Discussed with the day team, who will follow-up with case management and await neurosurg recommendations.     Roxy Horseman, PA-C 06/20/17 0524    Abelino Derrick, MD 06/20/17 [phone]

## 2017-06-20 NOTE — ED Notes (Signed)
IV removed.

## 2017-06-20 NOTE — ED Notes (Signed)
Patient ambulated to BR with walker and moderate to none assistance. Patient states pain is improved.

## 2017-06-20 NOTE — ED Provider Notes (Signed)
8:56 AM Assumed care from Dr. Corlis Leak and seen with PE, please see their notes for full history, physical and decision making until this point. In brief this is a 41 y.o. year old female who presented to the ED tonight with Numbness and Extremity Weakness     Apparently waiting for neurosurgery consult for disposition. Discussed with neurosurgery and the plan will be to discharge home with affective pain medicine as she has surgery scheduled for Wednesday and there is no acute indication for an emergent surgery at this time. Patient is a lately relatively well with a walker and pain control. Will be discharged on the same.   Discharge instructions, including strict return precautions for new or worsening symptoms, given. Patient and/or family verbalized understanding and agreement with the plan as described.   Labs, studies and imaging reviewed by myself and considered in medical decision making if ordered. Imaging interpreted by radiology.  Labs Reviewed  CBC - Abnormal; Notable for the following:       Result Value   Hemoglobin 11.5 (*)    HCT 35.9 (*)    MCH 25.8 (*)    RDW 16.7 (*)    All other components within normal limits  COMPREHENSIVE METABOLIC PANEL - Abnormal; Notable for the following:    Potassium 5.4 (*)    CO2 21 (*)    Calcium 8.2 (*)    AST 47 (*)    ALT 12 (*)    Total Bilirubin 1.6 (*)    All other components within normal limits  PROTIME-INR  I-STAT CHEM 8, ED  I-STAT BETA HCG BLOOD, ED ([facility], [facility], [facility] ONLY)    MR LUMBAR SPINE WO CONTRAST  Final Result      No Follow-up on file.    Cameo Shewell, Barbara Cower, MD 06/22/17 [phone]

## 2018-01-26 ENCOUNTER — Encounter (HOSPITAL_COMMUNITY): Payer: Self-pay

## 2021-01-16 ENCOUNTER — Other Ambulatory Visit: Payer: Self-pay | Admitting: Neurological Surgery

## 2021-01-22 ENCOUNTER — Encounter (HOSPITAL_COMMUNITY): Payer: Self-pay | Admitting: Neurological Surgery

## 2021-01-22 ENCOUNTER — Other Ambulatory Visit (HOSPITAL_COMMUNITY)
Admission: RE | Admit: 2021-01-22 | Discharge: 2021-01-22 | Disposition: A | Discharge status: Home / Self Care | Payer: 59 | Source: Ambulatory Visit | Attending: Neurological Surgery | Admitting: Neurological Surgery

## 2021-01-22 DIAGNOSIS — Z01812 Encounter for preprocedural laboratory examination: Secondary | ICD-10-CM | POA: Insufficient documentation

## 2021-01-22 DIAGNOSIS — Z20822 Contact with and (suspected) exposure to covid-19: Secondary | ICD-10-CM | POA: Insufficient documentation

## 2021-01-22 NOTE — Progress Notes (Signed)
EKG: 06/19/17 CXR: na ECHO: denies Stress Test: denies Cardiac Cath: denies  Fasting Blood Sugar- na Checks Blood Sugar__na_ times a day  OSA/CPAP:  No  ASA/Blood Thinners: No  Covid test 01/22/21  Anesthesia Review: No  Patient denies shortness of breath, fever, cough, and chest pain at PAT appointment.  Patient verbalized understanding of instructions provided today at the PAT appointment.  Patient asked to review instructions at home and day of surgery.

## 2021-01-23 LAB — SARS CORONAVIRUS 2 (TAT 6-24 HRS): SARS Coronavirus 2: NEGATIVE

## 2021-01-24 ENCOUNTER — Encounter (HOSPITAL_COMMUNITY)
Admission: RE | Disposition: A | Discharge status: Home / Self Care | Payer: Self-pay | Source: Home / Self Care | Attending: Neurological Surgery

## 2021-01-24 ENCOUNTER — Inpatient Hospital Stay (HOSPITAL_COMMUNITY): Payer: 59 | Admitting: Certified Registered Nurse Anesthetist

## 2021-01-24 ENCOUNTER — Encounter (HOSPITAL_COMMUNITY): Payer: Self-pay | Admitting: Neurological Surgery

## 2021-01-24 ENCOUNTER — Inpatient Hospital Stay (HOSPITAL_COMMUNITY): Payer: 59

## 2021-01-24 ENCOUNTER — Inpatient Hospital Stay (HOSPITAL_COMMUNITY)
Admission: RE | Admit: 2021-01-24 | Discharge: 2021-01-25 | DRG: 455 | Disposition: A | Discharge status: Home / Self Care | Payer: 59 | Attending: Neurological Surgery | Admitting: Neurological Surgery

## 2021-01-24 ENCOUNTER — Other Ambulatory Visit: Payer: Self-pay

## 2021-01-24 DIAGNOSIS — M5117 Intervertebral disc disorders with radiculopathy, lumbosacral region: Secondary | ICD-10-CM | POA: Diagnosis present

## 2021-01-24 DIAGNOSIS — M5416 Radiculopathy, lumbar region: Secondary | ICD-10-CM | POA: Diagnosis present

## 2021-01-24 DIAGNOSIS — Z20822 Contact with and (suspected) exposure to covid-19: Secondary | ICD-10-CM | POA: Diagnosis present

## 2021-01-24 DIAGNOSIS — M21379 Foot drop, unspecified foot: Secondary | ICD-10-CM | POA: Diagnosis present

## 2021-01-24 DIAGNOSIS — Z419 Encounter for procedure for purposes other than remedying health state, unspecified: Secondary | ICD-10-CM

## 2021-01-24 HISTORY — PX: TRANSFORAMINAL LUMBAR INTERBODY FUSION (TLIF) WITH PEDICLE SCREW FIXATION 1 LEVEL: SHX6141

## 2021-01-24 LAB — CBC
HCT: 40 % (ref 36.0–46.0)
Hemoglobin: 12.5 g/dL (ref 12.0–15.0)
MCH: 28.9 pg (ref 26.0–34.0)
MCHC: 31.3 g/dL (ref 30.0–36.0)
MCV: 92.4 fL (ref 80.0–100.0)
Platelets: 260 10*3/uL (ref 150–400)
RBC: 4.33 MIL/uL (ref 3.87–5.11)
RDW: 14.1 % (ref 11.5–15.5)
WBC: 12.8 10*3/uL — ABNORMAL HIGH (ref 4.0–10.5)
nRBC: 0.2 % (ref 0.0–0.2)

## 2021-01-24 LAB — TYPE AND SCREEN
ABO/RH(D): A NEG
Antibody Screen: NEGATIVE

## 2021-01-24 LAB — POCT PREGNANCY, URINE: Preg Test, Ur: NEGATIVE

## 2021-01-24 LAB — ABO/RH: ABO/RH(D): A NEG

## 2021-01-24 SURGERY — TRANSFORAMINAL LUMBAR INTERBODY FUSION (TLIF) WITH PEDICLE SCREW FIXATION 1 LEVEL
Anesthesia: General | Site: Spine Lumbar | Laterality: Right

## 2021-01-24 MED ORDER — CHLORHEXIDINE GLUCONATE 0.12 % MT SOLN: 15.0000 mL | Freq: Once | OROMUCOSAL | Status: AC

## 2021-01-24 MED ORDER — ONDANSETRON HCL 4 MG/2ML IJ SOLN: 4.0000 mg | Freq: Four times a day (QID) | INTRAMUSCULAR | Status: DC | PRN

## 2021-01-24 MED ORDER — ACETAMINOPHEN 650 MG RE SUPP: 650.0000 mg | RECTAL | Status: DC | PRN

## 2021-01-24 MED ORDER — THROMBIN 5000 UNITS EX SOLR
CUTANEOUS | Status: AC
  Filled 2021-01-24: qty 5000

## 2021-01-24 MED ORDER — SODIUM CHLORIDE 0.9% FLUSH: 3.0000 mL | Freq: Two times a day (BID) | INTRAVENOUS | Status: DC

## 2021-01-24 MED ORDER — HYDROMORPHONE HCL 1 MG/ML IJ SOLN
INTRAMUSCULAR | Status: AC
  Filled 2021-01-24: qty 1

## 2021-01-24 MED ORDER — 0.9 % SODIUM CHLORIDE (POUR BTL) OPTIME
TOPICAL | Status: DC | PRN
  Administered 2021-01-24: 1000 mL

## 2021-01-24 MED ORDER — DEXAMETHASONE SODIUM PHOSPHATE 10 MG/ML IJ SOLN
INTRAMUSCULAR | Status: DC | PRN
  Administered 2021-01-24: 10 mg via INTRAVENOUS

## 2021-01-24 MED ORDER — CYCLOBENZAPRINE HCL 10 MG PO TABS
ORAL_TABLET | ORAL | Status: AC
  Filled 2021-01-24: qty 1

## 2021-01-24 MED ORDER — FENTANYL CITRATE (PF) 250 MCG/5ML IJ SOLN
INTRAMUSCULAR | Status: AC
  Filled 2021-01-24: qty 5

## 2021-01-24 MED ORDER — ORAL CARE MOUTH RINSE: 15.0000 mL | Freq: Once | OROMUCOSAL | Status: AC

## 2021-01-24 MED ORDER — BUPIVACAINE HCL (PF) 0.5 % IJ SOLN
INTRAMUSCULAR | Status: AC
  Filled 2021-01-24: qty 30

## 2021-01-24 MED ORDER — ADULT MULTIVITAMIN W/MINERALS CH
1.0000 | ORAL_TABLET | Freq: Every morning | ORAL | Status: DC
  Administered 2021-01-25: 1 via ORAL
  Filled 2021-01-24: qty 1

## 2021-01-24 MED ORDER — SUGAMMADEX SODIUM 200 MG/2ML IV SOLN
INTRAVENOUS | Status: DC | PRN
  Administered 2021-01-24: 200 mg via INTRAVENOUS

## 2021-01-24 MED ORDER — ONDANSETRON HCL 4 MG/2ML IJ SOLN
INTRAMUSCULAR | Status: DC | PRN
  Administered 2021-01-24: 4 mg via INTRAVENOUS

## 2021-01-24 MED ORDER — LIDOCAINE-EPINEPHRINE 1 %-1:100000 IJ SOLN
INTRAMUSCULAR | Status: AC
  Filled 2021-01-24: qty 1

## 2021-01-24 MED ORDER — LACTATED RINGERS IV SOLN: INTRAVENOUS | Status: DC

## 2021-01-24 MED ORDER — FENTANYL CITRATE (PF) 250 MCG/5ML IJ SOLN
INTRAMUSCULAR | Status: DC | PRN
  Administered 2021-01-24 (×2): 50 ug via INTRAVENOUS
  Administered 2021-01-24: 100 ug via INTRAVENOUS
  Administered 2021-01-24: 50 ug via INTRAVENOUS

## 2021-01-24 MED ORDER — CEFAZOLIN SODIUM-DEXTROSE 2-4 GM/100ML-% IV SOLN
INTRAVENOUS | Status: AC
  Filled 2021-01-24: qty 100

## 2021-01-24 MED ORDER — CHLORHEXIDINE GLUCONATE CLOTH 2 % EX PADS: 6.0000 | MEDICATED_PAD | Freq: Once | CUTANEOUS | Status: DC

## 2021-01-24 MED ORDER — MIDAZOLAM HCL 2 MG/2ML IJ SOLN
INTRAMUSCULAR | Status: DC | PRN
  Administered 2021-01-24: 2 mg via INTRAVENOUS

## 2021-01-24 MED ORDER — PANTOPRAZOLE SODIUM 40 MG PO TBEC
40.0000 mg | DELAYED_RELEASE_TABLET | Freq: Every day | ORAL | Status: DC
  Administered 2021-01-25: 40 mg via ORAL
  Filled 2021-01-24 (×2): qty 1

## 2021-01-24 MED ORDER — SODIUM CHLORIDE 0.9% FLUSH: 3.0000 mL | INTRAVENOUS | Status: DC | PRN

## 2021-01-24 MED ORDER — CHLORHEXIDINE GLUCONATE 0.12 % MT SOLN
OROMUCOSAL | Status: AC
  Administered 2021-01-24: 15 mL via OROMUCOSAL
  Filled 2021-01-24: qty 15

## 2021-01-24 MED ORDER — BUPIVACAINE HCL (PF) 0.5 % IJ SOLN
INTRAMUSCULAR | Status: DC | PRN
  Administered 2021-01-24: 5 mL

## 2021-01-24 MED ORDER — CEFAZOLIN SODIUM-DEXTROSE 2-4 GM/100ML-% IV SOLN
2.0000 g | INTRAVENOUS | Status: AC
  Administered 2021-01-24: 2 g via INTRAVENOUS

## 2021-01-24 MED ORDER — LACTASE 3000 UNITS PO TABS
3000.0000 [IU] | ORAL_TABLET | Freq: Every day | ORAL | Status: DC | PRN
  Filled 2021-01-24: qty 2

## 2021-01-24 MED ORDER — MENTHOL 3 MG MT LOZG: 1.0000 | LOZENGE | OROMUCOSAL | Status: DC | PRN

## 2021-01-24 MED ORDER — LIDOCAINE-EPINEPHRINE 1 %-1:100000 IJ SOLN
INTRAMUSCULAR | Status: DC | PRN
  Administered 2021-01-24: 5 mL

## 2021-01-24 MED ORDER — OXYCODONE HCL 5 MG PO TABS
10.0000 mg | ORAL_TABLET | ORAL | Status: DC | PRN
Start: 2021-01-24 — End: 2021-01-25
  Administered 2021-01-24 – 2021-01-25 (×4): 10 mg via ORAL
  Filled 2021-01-24 (×4): qty 2

## 2021-01-24 MED ORDER — HYDROMORPHONE HCL 1 MG/ML IJ SOLN
1.0000 mg | INTRAMUSCULAR | Status: DC | PRN
Start: 2021-01-24 — End: 2021-01-25

## 2021-01-24 MED ORDER — CYCLOBENZAPRINE HCL 10 MG PO TABS
10.0000 mg | ORAL_TABLET | Freq: Three times a day (TID) | ORAL | Status: DC | PRN
  Administered 2021-01-24 – 2021-01-25 (×3): 10 mg via ORAL
  Filled 2021-01-24 (×2): qty 1

## 2021-01-24 MED ORDER — ONDANSETRON HCL 4 MG PO TABS: 4.0000 mg | ORAL_TABLET | Freq: Four times a day (QID) | ORAL | Status: DC | PRN

## 2021-01-24 MED ORDER — ROCURONIUM BROMIDE 10 MG/ML (PF) SYRINGE
PREFILLED_SYRINGE | INTRAVENOUS | Status: DC | PRN
  Administered 2021-01-24: 30 mg via INTRAVENOUS
  Administered 2021-01-24: 60 mg via INTRAVENOUS
  Administered 2021-01-24: 20 mg via INTRAVENOUS
  Administered 2021-01-24: 40 mg via INTRAVENOUS

## 2021-01-24 MED ORDER — ROCURONIUM BROMIDE 10 MG/ML (PF) SYRINGE
PREFILLED_SYRINGE | INTRAVENOUS | Status: AC
  Filled 2021-01-24: qty 10

## 2021-01-24 MED ORDER — PHENYLEPHRINE HCL (PRESSORS) 10 MG/ML IV SOLN
INTRAVENOUS | Status: AC
  Filled 2021-01-24: qty 1

## 2021-01-24 MED ORDER — THROMBIN 5000 UNITS EX SOLR
OROMUCOSAL | Status: DC | PRN
  Administered 2021-01-24: 5 mL via TOPICAL

## 2021-01-24 MED ORDER — DEXMEDETOMIDINE (PRECEDEX) IN NS 20 MCG/5ML (4 MCG/ML) IV SYRINGE
PREFILLED_SYRINGE | INTRAVENOUS | Status: DC | PRN
  Administered 2021-01-24: 8 ug via INTRAVENOUS
  Administered 2021-01-24: 12 ug via INTRAVENOUS

## 2021-01-24 MED ORDER — MIDAZOLAM HCL 2 MG/2ML IJ SOLN
INTRAMUSCULAR | Status: AC
  Filled 2021-01-24: qty 2

## 2021-01-24 MED ORDER — POLYETHYLENE GLYCOL 3350 17 G PO PACK: 17.0000 g | PACK | Freq: Every day | ORAL | Status: DC | PRN

## 2021-01-24 MED ORDER — PROPOFOL 10 MG/ML IV BOLUS
INTRAVENOUS | Status: DC | PRN
Start: 2021-01-24 — End: 2021-01-24
  Administered 2021-01-24: 200 mg via INTRAVENOUS

## 2021-01-24 MED ORDER — SODIUM CHLORIDE 0.9 % IV SOLN: 250.0000 mL | INTRAVENOUS | Status: DC

## 2021-01-24 MED ORDER — ACETAMINOPHEN 325 MG PO TABS
650.0000 mg | ORAL_TABLET | ORAL | Status: DC | PRN
  Administered 2021-01-24 – 2021-01-25 (×3): 650 mg via ORAL
  Filled 2021-01-24 (×3): qty 2

## 2021-01-24 MED ORDER — PREGABALIN 100 MG PO CAPS
200.0000 mg | ORAL_CAPSULE | Freq: Every morning | ORAL | Status: DC
  Administered 2021-01-25: 200 mg via ORAL
  Filled 2021-01-24: qty 2

## 2021-01-24 MED ORDER — OXYCODONE HCL 5 MG PO TABS: 5.0000 mg | ORAL_TABLET | ORAL | Status: DC | PRN

## 2021-01-24 MED ORDER — FLUTICASONE PROPIONATE 50 MCG/ACT NA SUSP
2.0000 | Freq: Every day | NASAL | Status: DC | PRN
  Filled 2021-01-24: qty 16

## 2021-01-24 MED ORDER — PHENOL 1.4 % MT LIQD: 1.0000 | OROMUCOSAL | Status: DC | PRN

## 2021-01-24 MED ORDER — HYDROMORPHONE HCL 1 MG/ML IJ SOLN
INTRAMUSCULAR | Status: DC | PRN
  Administered 2021-01-24: 1 mg via INTRAVENOUS

## 2021-01-24 MED ORDER — HYDROMORPHONE HCL 1 MG/ML IJ SOLN
0.2500 mg | INTRAMUSCULAR | Status: DC | PRN
  Administered 2021-01-24 (×3): 0.5 mg via INTRAVENOUS

## 2021-01-24 MED ORDER — DOCUSATE SODIUM 100 MG PO CAPS
100.0000 mg | ORAL_CAPSULE | Freq: Two times a day (BID) | ORAL | Status: DC
  Administered 2021-01-24 – 2021-01-25 (×2): 100 mg via ORAL
  Filled 2021-01-24 (×2): qty 1

## 2021-01-24 MED ORDER — MEPERIDINE HCL 25 MG/ML IJ SOLN
6.2500 mg | INTRAMUSCULAR | Status: DC | PRN
Start: 2021-01-24 — End: 2021-01-24

## 2021-01-24 MED ORDER — PHENYLEPHRINE HCL-NACL 10-0.9 MG/250ML-% IV SOLN
INTRAVENOUS | Status: DC | PRN
  Administered 2021-01-24: 20 ug/min via INTRAVENOUS

## 2021-01-24 MED ORDER — PROMETHAZINE HCL 25 MG/ML IJ SOLN: 6.2500 mg | INTRAMUSCULAR | Status: DC | PRN

## 2021-01-24 MED ORDER — LIDOCAINE 2% (20 MG/ML) 5 ML SYRINGE
INTRAMUSCULAR | Status: DC | PRN
  Administered 2021-01-24: 100 mg via INTRAVENOUS

## 2021-01-24 MED ORDER — CEFAZOLIN SODIUM-DEXTROSE 2-4 GM/100ML-% IV SOLN
2.0000 g | Freq: Three times a day (TID) | INTRAVENOUS | Status: AC
  Administered 2021-01-24: 2 g via INTRAVENOUS
  Filled 2021-01-24: qty 100

## 2021-01-24 SURGICAL SUPPLY — 67 items
BAND RUBBER #18 3X1/16 STRL (MISCELLANEOUS) ×4 IMPLANT
BASKET BONE COLLECTION (BASKET) ×2 IMPLANT
BENZOIN TINCTURE PRP APPL 2/3 (GAUZE/BANDAGES/DRESSINGS) IMPLANT
BLADE CLIPPER SURG (BLADE) IMPLANT
BLADE SURG 11 STRL SS (BLADE) ×2 IMPLANT
BUR MATCHSTICK NEURO 3.0 LAGG (BURR) ×2 IMPLANT
BUR PRECISION FLUTE 5.0 (BURR) ×2 IMPLANT
CANISTER SUCT 3000ML PPV (MISCELLANEOUS) ×2 IMPLANT
CNTNR URN SCR LID CUP LEK RST (MISCELLANEOUS) ×1 IMPLANT
CONT SPEC 4OZ STRL OR WHT (MISCELLANEOUS) ×1
COVER BACK TABLE 60X90IN (DRAPES) ×2 IMPLANT
COVER WAND RF STERILE (DRAPES) ×2 IMPLANT
DECANTER SPIKE VIAL GLASS SM (MISCELLANEOUS) ×2 IMPLANT
DERMABOND ADVANCED (GAUZE/BANDAGES/DRESSINGS) ×1
DERMABOND ADVANCED .7 DNX12 (GAUZE/BANDAGES/DRESSINGS) ×1 IMPLANT
DEVICE INTERBODY ELEVATE 23X7 (Cage) ×2 IMPLANT
DRAPE C-ARM 42X72 X-RAY (DRAPES) ×2 IMPLANT
DRAPE C-ARMOR (DRAPES) ×2 IMPLANT
DRAPE LAPAROTOMY 100X72X124 (DRAPES) ×2 IMPLANT
DRAPE MICROSCOPE LEICA (MISCELLANEOUS) ×2 IMPLANT
DRAPE SURG 17X23 STRL (DRAPES) ×2 IMPLANT
DURAPREP 26ML APPLICATOR (WOUND CARE) ×2 IMPLANT
ELECT REM PT RETURN 9FT ADLT (ELECTROSURGICAL) ×2
ELECTRODE REM PT RTRN 9FT ADLT (ELECTROSURGICAL) ×1 IMPLANT
GAUZE 4X4 16PLY RFD (DISPOSABLE) IMPLANT
GAUZE SPONGE 4X4 12PLY STRL (GAUZE/BANDAGES/DRESSINGS) IMPLANT
GLOVE BIOGEL PI IND STRL 7.5 (GLOVE) ×2 IMPLANT
GLOVE BIOGEL PI INDICATOR 7.5 (GLOVE) ×2
GLOVE ECLIPSE 7.5 STRL STRAW (GLOVE) ×4 IMPLANT
GLOVE EXAM NITRILE LRG STRL (GLOVE) IMPLANT
GLOVE EXAM NITRILE XL STR (GLOVE) IMPLANT
GLOVE EXAM NITRILE XS STR PU (GLOVE) IMPLANT
GLOVE SRG 8 PF TXTR STRL LF DI (GLOVE) ×2 IMPLANT
GLOVE SURG UNDER POLY LF SZ8 (GLOVE) ×2
GOWN STRL REUS W/ TWL LRG LVL3 (GOWN DISPOSABLE) ×1 IMPLANT
GOWN STRL REUS W/ TWL XL LVL3 (GOWN DISPOSABLE) ×1 IMPLANT
GOWN STRL REUS W/TWL 2XL LVL3 (GOWN DISPOSABLE) ×4 IMPLANT
GOWN STRL REUS W/TWL LRG LVL3 (GOWN DISPOSABLE) ×1
GOWN STRL REUS W/TWL XL LVL3 (GOWN DISPOSABLE) ×1
HEMOSTAT POWDER KIT SURGIFOAM (HEMOSTASIS) ×2 IMPLANT
KIT BASIN OR (CUSTOM PROCEDURE TRAY) ×2 IMPLANT
KIT POSITION SURG JACKSON T1 (MISCELLANEOUS) ×2 IMPLANT
KIT TURNOVER KIT B (KITS) ×2 IMPLANT
MILL MEDIUM DISP (BLADE) IMPLANT
NEEDLE HYPO 18GX1.5 BLUNT FILL (NEEDLE) IMPLANT
NEEDLE HYPO 22GX1.5 SAFETY (NEEDLE) ×2 IMPLANT
NEEDLE SPNL 18GX3.5 QUINCKE PK (NEEDLE) IMPLANT
NS IRRIG 1000ML POUR BTL (IV SOLUTION) ×2 IMPLANT
PACK LAMINECTOMY NEURO (CUSTOM PROCEDURE TRAY) ×2 IMPLANT
PAD ARMBOARD 7.5X6 YLW CONV (MISCELLANEOUS) IMPLANT
PUTTY DBM GRAFTON 5CC (Putty) ×4 IMPLANT
ROD SPINAL 5.5X35 CP 4 TI (Rod) ×4 IMPLANT
SCREW SET SOLERA (Screw) ×8 IMPLANT
SCREW SET SOLERA TI5.5 (Screw) ×4 IMPLANT
SCREW SOLERA 6.5X35MM (Screw) ×8 IMPLANT
SPONGE LAP 4X18 RFD (DISPOSABLE) IMPLANT
SPONGE SURGIFOAM ABS GEL 100 (HEMOSTASIS) IMPLANT
STRIP CLOSURE SKIN 1/2X4 (GAUZE/BANDAGES/DRESSINGS) IMPLANT
SUT MNCRL AB 3-0 PS2 18 (SUTURE) ×2 IMPLANT
SUT VIC AB 0 CT1 18XCR BRD8 (SUTURE) ×1 IMPLANT
SUT VIC AB 0 CT1 8-18 (SUTURE) ×1
SUT VIC AB 2-0 CP2 18 (SUTURE) ×2 IMPLANT
SYR 3ML LL SCALE MARK (SYRINGE) IMPLANT
TOWEL GREEN STERILE (TOWEL DISPOSABLE) ×2 IMPLANT
TOWEL GREEN STERILE FF (TOWEL DISPOSABLE) ×2 IMPLANT
TRAY FOLEY MTR SLVR 16FR STAT (SET/KITS/TRAYS/PACK) ×2 IMPLANT
WATER STERILE IRR 1000ML POUR (IV SOLUTION) ×2 IMPLANT

## 2021-01-24 NOTE — Transfer of Care (Signed)
Immediate Anesthesia Transfer of Care Note  Patient: Veronica Lawrence  Procedure(s) Performed: Right Lumbar Five Sacral One Open Transforaminal Lumbar Interbody Fusion (Right Spine Lumbar)  Patient Location: PACU  Anesthesia Type:General  Level of Consciousness: awake, alert  and oriented  Airway & Oxygen Therapy: Patient Spontanous Breathing and Patient connected to face mask oxygen  Post-op Assessment: Report given to RN and Post -op Vital signs reviewed and stable  Post vital signs: Reviewed and stable  Last Vitals:  Vitals Value Taken Time  BP    Temp    Pulse    Resp    SpO2      Last Pain:  Vitals:   01/24/21 1046  TempSrc:   PainSc: 6          Complications: No complications documented.

## 2021-01-24 NOTE — Op Note (Signed)
PATIENT: Veronica Lawrence  DAY OF SURGERY: 01/24/21   PRE-OPERATIVE DIAGNOSIS:  Lumbar radiculopathy   POST-OPERATIVE DIAGNOSIS:  Same   PROCEDURE:  Right L5-S1 open facetectomy, R L5-S1 TLIF, bilateral L5-S1 pedicle screw instrumentation with posterolateral fusion   SURGEON:  Surgeon(s) and Role:    Jadene Pierini, MD - Primary    Donalee Citrin, MD - Assisting   ANESTHESIA: ETGA   BRIEF HISTORY: This is a 45 year old woman who presented with recurrent right lower extremity radiculopathy in S1 greater than L5 distribution. 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 She had two prior microdiscs at this level by another surgeon. Given it was her third disc herniation at this level, I recommended a TLIF. This was discussed with the patient as well as risks, benefits, and alternatives and wished to proceed with surgery.   OPERATIVE DETAIL: The patient was taken to the operating room and anesthesia was induced by the anesthesia team. They were placed on the OR table in the prone position with padding of all pressure points. A formal time out was performed with two patient identifiers and confirmed the operative site. The operative site was marked, hair was clipped with surgical clippers, the area was then prepped and draped in a sterile fashion. Fluoro was used to localize the operative level and the patient's prior midline incision was opened to expose L5-S1. Subperiosteal dissection was performed bilaterally and fluoroscopy was again used to confirm the surgical level.   Decompression was performed, which consisted of a complete right facetectomy with decompression of the exiting nerve root and extension of her prior laminectomy on the right to expose the thecal sac and traversing nerve root. Both were decompressed along their visualized course. Dr. Wynetta Emery scrubbed in to help with retraction. The disc space was then identified and retractors were placed to protect the exiting  root as well as the traversing root along with the thecal sac medially. A discectomy was performed using a combination of shavers, rongeurs, curettes, and endplate prepping curettes with intermittent fluoroscopy for assistance. An expandable interbody cage (Medtronic) was placed with fluoroscopic guidance while visually protecting the nerve roots and thecal sac. It was expanded, checked with fluoro in AP and lateral views, then released from the insertion device.   Pedicle instrumentation was then performed. Fluoroscopy was used to guide placement of bilateral pedicle screws (Medtronic) at L5 and S1. These were placed by localizing the pedicle with standard landmarks, drilling a pilot hole, cannulating the pedicle with an awl, palpating for pedicle wall breaches, tapping, palpating, and then placing the screw. Fluoroscopy was used intermittently as needed for guidance during their placement. These were connected with rods bilaterally and final tightened according to manufacturer torque specifications. The bone was thoroughly decorticated over the fusion surfaces posterolaterally and long the residual facet and transverse processes. The previously resected bone fragments were morselized and used as autograft with the addition of cortical bone chips ([employer]tronic).   All instrument and sponge counts were correct, the incision was then closed in layers. The patient was then returned to anesthesia for emergence. No apparent complications at the completion of the procedure.   EBL:    DRAINS: none   SPECIMENS: none   Jadene Pierini, MD 01/24/21 1:16 PM

## 2021-01-24 NOTE — Anesthesia Preprocedure Evaluation (Addendum)
Anesthesia Evaluation  Patient identified by MRN, date of birth, ID band Patient awake    Reviewed: Allergy & Precautions, NPO status , Patient's Chart, lab work & pertinent test results  Airway Mallampati: II  TM Distance: >3 FB Neck ROM: Full    Dental no notable dental hx. (+) Dental Advisory Given, Teeth Intact   Pulmonary neg pulmonary ROS,    Pulmonary exam normal breath sounds clear to auscultation       Cardiovascular negative cardio ROS Normal cardiovascular exam Rhythm:Regular Rate:Normal     Neuro/Psych  Headaches, PSYCHIATRIC DISORDERS Anxiety Depression    GI/Hepatic Neg liver ROS, GERD  ,  Endo/Other  Morbid obesity  Renal/GU negative Renal ROS     Musculoskeletal negative musculoskeletal ROS (+)   Abdominal (+) + obese,   Peds  Hematology negative hematology ROS (+)   Anesthesia Other Findings   Reproductive/Obstetrics                            Anesthesia Physical Anesthesia Plan  ASA: III  Anesthesia Plan: General   Post-op Pain Management:    Induction: Intravenous  PONV Risk Score and Plan: 4 or greater and Ondansetron, Dexamethasone, Midazolam, Diphenhydramine and Treatment may vary due to age or medical condition  Airway Management Planned: Oral ETT  Additional Equipment: None  Intra-op Plan:   Post-operative Plan: Extubation in OR  Informed Consent: I have reviewed the patients History and Physical, chart, labs and discussed the procedure including the risks, benefits and alternatives for the proposed anesthesia with the patient or authorized representative who has indicated his/her understanding and acceptance.     Dental advisory given  Plan Discussed with: CRNA  Anesthesia Plan Comments:        Anesthesia Quick Evaluation

## 2021-01-24 NOTE — H&P (Signed)
Surgical H&P Update  HPI: 45 y.o. woman with RLE radiculopathy after 2 prior microdiscectomies, Sx predominantly in R S1 distribution. No changes in health since she was last seen. Still having radicular Sx and wishes to proceed with surgery.  PMHx:  Past Medical History:  Diagnosis Date  . Anxiety   . Depression   . Fatigue   . Headache    FamHx:  Family History  Problem Relation Age of Onset  . Dementia Maternal Grandmother   . Bipolar disorder Maternal Grandmother    SocHx:  reports that she has never smoked. She has never used smokeless tobacco. She reports current alcohol use of about 1.0 - 2.0 standard drink of alcohol per week. She reports that she does not use drugs.  Physical Exam: AOx3, PERRL, FS, TM  Strength 5/5 x4, SILTx4 except R S1 numbness  Assesment/Plan: 45 y.o. woman with R S1 radiculopathy after 2 prior 5-1 microdiscectomies, here for R 5-1 open TLIF. Risks, benefits, and alternatives discussed and the patient would like to continue with surgery.  -OR today -3C post-op  Jadene Pierini, MD 01/24/21 1:07 PM

## 2021-01-24 NOTE — Anesthesia Procedure Notes (Signed)
Procedure Name: Intubation Date/Time: 01/24/2021 1:39 PM Performed by: Darletta Moll, CRNA Pre-anesthesia Checklist: Patient identified, Emergency Drugs available, Suction available and Patient being monitored Patient Re-evaluated:Patient Re-evaluated prior to induction Oxygen Delivery Method: Circle system utilized Preoxygenation: Pre-oxygenation with 100% oxygen Induction Type: IV induction Ventilation: Mask ventilation without difficulty Laryngoscope Size: Mac and 3 Grade View: Grade I Tube type: Oral Tube size: 7.0 mm Number of attempts: 1 Airway Equipment and Method: Stylet Placement Confirmation: ETT inserted through vocal cords under direct vision,  positive ETCO2 and breath sounds checked- equal and bilateral Secured at: 21 cm Tube secured with: Tape Dental Injury: Teeth and Oropharynx as per pre-operative assessment

## 2021-01-25 ENCOUNTER — Encounter (HOSPITAL_COMMUNITY): Payer: Self-pay | Admitting: Neurological Surgery

## 2021-01-25 MED ORDER — CEFAZOLIN SODIUM-DEXTROSE 2-4 GM/100ML-% IV SOLN
2.0000 g | INTRAVENOUS | Status: AC
  Administered 2021-01-25: 2 g via INTRAVENOUS
  Filled 2021-01-25: qty 100

## 2021-01-25 MED ORDER — CYCLOBENZAPRINE HCL 10 MG PO TABS: 10.0000 mg | ORAL_TABLET | Freq: Three times a day (TID) | ORAL | 0 refills | Status: AC | PRN

## 2021-01-25 MED ORDER — OXYCODONE HCL 5 MG PO TABS: 5.0000 mg | ORAL_TABLET | ORAL | 0 refills | Status: AC | PRN

## 2021-01-25 NOTE — Discharge Summary (Signed)
Discharge Summary  Date of Admission: 01/24/2021  Date of Discharge: 01/25/21  Attending Physician: Autumn Patty, MD  Hospital Course: Patient was admitted following an uncomplicated right open L5-S1 TLIF. She was recovered in PACU and transferred to [facility]. Her preop foot drop already was improving and her radicular pain was resolved on POD1. Her hospital course was uncomplicated and the patient was discharged home on 01/25/21. She will follow up in clinic with me in 2 weeks.  Neurologic exam at discharge:  AOx3, PERRL, EOMI, FS, TM Strength 5/5 x4 except 4+/5 in R DF/EHL, SILTx4 except R L5 numbness  Discharge diagnosis: Lumbar radiculopathy  Jadene Pierini, MD 01/25/21 10:05 PM

## 2021-01-25 NOTE — Progress Notes (Signed)
Neurosurgery Service Progress Note  Subjective: No acute events overnight, radicular pain resolved, numbness improved, foot strength dramatically improved   Objective: Vitals:   01/24/21 2000 01/24/21 2312 01/25/21 0306 01/25/21 0815  BP: (!) 146/78 135/75 130/83 123/77  Pulse: 74 73 63 72  Resp: 20 20 20 18   Temp: 97.9 F (36.6 C) 98.1 F (36.7 C) 97.9 F (36.6 C) 97.8 F (36.6 C)  TempSrc: Oral Oral Oral   SpO2: 99% 95% 97% 100%  Weight:      Height:        Physical Exam: AOx3, PERRL, EOMI, FS, TM, Strength 4+/5 in R EHL/DF, otherwise 5/5 x4, SILTx4 except R S1 numbness  Assessment & Plan: 45 y.o. woman s/p R L5-S1 open TLIF, recovering well.  -discharge home today  45  01/25/21 9:15 AM

## 2021-01-25 NOTE — Anesthesia Postprocedure Evaluation (Signed)
Anesthesia Post Note  Patient: Veronica Lawrence  Procedure(s) Performed: Right Lumbar Five Sacral One Open Transforaminal Lumbar Interbody Fusion (Right Spine Lumbar)     Patient location during evaluation: PACU Anesthesia Type: General Level of consciousness: sedated and patient cooperative Pain management: pain level controlled Vital Signs Assessment: post-procedure vital signs reviewed and stable Respiratory status: spontaneous breathing Cardiovascular status: stable Anesthetic complications: no   No complications documented.  Last Vitals:  Vitals:   01/25/21 0306 01/25/21 0815  BP: 130/83 123/77  Pulse: 63 72  Resp: 20 18  Temp: 36.6 C 36.6 C  SpO2: 97% 100%    Last Pain:  Vitals:   01/25/21 0921  TempSrc:   PainSc: 5                  Lewie Loron

## 2021-01-25 NOTE — Evaluation (Signed)
Occupational Therapy Evaluation Patient Details Name: Veronica Lawrence MRN: [id number] DOB: 1976-08-13 Today's Date: 01/25/2021    History of Present Illness 45 y.o. female presenting with RLE radiculopathy after 2 prior microdiscectomies s/p R L5-S1 PLIF. PMHx significant for anxiety/depression.   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 Patient currently presents near baseline level of function demonstrating all observed ADLs with Mod I and increased time 2/2 soreness at incision. No AD needed. OT provided education on spinal precautions, home set-up to maximize safety and independence with self-care tasks, and acquisition/use of AE. Patient expressed verbal understanding. Patient does not require continued acute occupational therapy services with OT to sign off at this time.      Follow Up Recommendations  No OT follow up    Equipment Recommendations  None recommended by OT    Recommendations for Other Services       Precautions / Restrictions Precautions Precautions: Back Precaution Booklet Issued: Yes (comment) Precaution Comments: Reviewed written handout. Patient able to recall 3/3 back precautions from previous surgeries. Required Braces or Orthoses:  (No brace needed) Restrictions Weight Bearing Restrictions: No      Mobility Bed Mobility                    Transfers Overall transfer level: Independent               General transfer comment: Sit to stand from EOB x several trials with I.    Balance Overall balance assessment: No apparent balance deficits (not formally assessed)                                         ADL either performed or assessed with clinical judgement   ADL Overall ADL's : At baseline                                       General ADL Comments:  Demonstrates all observed ADLs with Mod I and increased time 2/2 pain. No AD.     Vision Baseline Vision/History: Wears glasses Wears Glasses: At all times Patient Visual Report: No change from baseline       Perception     Praxis      Pertinent Vitals/Pain Pain Assessment: 0-10 Pain Score: 6  Pain Location: Low back (incisional) Pain Descriptors / Indicators: Sore;Aching;Grimacing Pain Intervention(s): Limited activity within patient's tolerance;Monitored during session;Premedicated before session;Repositioned     Hand Dominance Right   Extremity/Trunk Assessment Upper Extremity Assessment Upper Extremity Assessment: Overall WFL for tasks assessed   Lower Extremity Assessment Lower Extremity Assessment: Defer to PT evaluation   Cervical / Trunk Assessment Cervical / Trunk Assessment: Other exceptions Cervical / Trunk Exceptions: s/p spinal surgery.   Communication Communication Communication: No difficulties   Cognition Arousal/Alertness: Awake/alert Behavior During Therapy: WFL for tasks assessed/performed Overall Cognitive Status: Within Functional Limits for tasks assessed                                     General Comments  Incision clean/dry. Open to room air.    Exercises  Shoulder Instructions      Home Living Family/patient expects to be discharged to:: Private residence Living Arrangements: Alone Available Help at Discharge: Family;Available 24 hours/day Type of Home: House Home Access: Stairs to enter [employer] of Steps: 1 Entrance Stairs-Rails: None Home Layout: One level     Bathroom Shower/Tub: Producer, television/film/video: Standard     Home Equipment: Hand held shower head          Prior Functioning/Environment Level of Independence: Independent        Comments: Independent with ADLs/IADLs. Works as a Agricultural consultant.        OT Problem List: Pain      OT  Treatment/Interventions:      OT Goals(Current goals can be found in the care plan section) Acute Rehab OT Goals Patient Stated Goal: To return home. OT Goal Formulation: With patient  OT Frequency:     Barriers to D/C:            Co-evaluation              AM-PAC OT "6 Clicks" Daily Activity     Outcome Measure Help from another person eating meals?: None Help from another person taking care of personal grooming?: None Help from another person toileting, which includes using toliet, bedpan, or urinal?: None Help from another person bathing (including washing, rinsing, drying)?: A Little Help from another person to put on and taking off regular upper body clothing?: None Help from another person to put on and taking off regular lower body clothing?: None 6 Click Score: 23   End of Session Nurse Communication: Mobility status  Activity Tolerance: Patient tolerated treatment well Patient left: in chair;with call bell/phone within reach  OT Visit Diagnosis: Muscle weakness (generalized) (M62.81)                Time: 5409-8119 OT Time Calculation (min): 24 min Charges:  OT General Charges $OT Visit: 1 Visit OT Evaluation $OT Eval Low Complexity: 1 Low OT Treatments $Self Care/Home Management : 8-22 mins  Daisi Kentner H. OTR/L Supplemental OT, Department of rehab services [phone]  Salwa Bai R H. 01/25/2021, 8:48 AM

## 2021-01-25 NOTE — Evaluation (Signed)
Physical Therapy Evaluation and Discharge Patient Details Name: Veronica Lawrence MRN: [id number] DOB: 1975-12-19 Today's Date: 01/25/2021   History of Present Illness  45 y.o. female presenting with RLE radiculopathy after 2 prior microdiscectomies s/p R L5-S1 PLIF on 01/24/2021. PMHx significant for anxiety/depression.    Clinical Impression  Patient evaluated by Physical Therapy with no further acute PT needs identified. All education has been completed and the patient has no further questions. Pt was able to demonstrate transfers and ambulation with gross independence and no AD. Pt was educated on precautions, positioning recommendations, appropriate activity progression, and car transfer. See below for any follow-up Physical Therapy or equipment needs. PT is signing off. Thank you for this referral.     Follow Up Recommendations No PT follow up;Supervision - Intermittent    Equipment Recommendations  None recommended by PT    Recommendations for Other Services       Precautions / Restrictions Precautions Precautions: Back Precaution Booklet Issued: Yes (comment) Precaution Comments: Reviewed written handout. Patient able to recall 3/3 back precautions from previous surgeries. Required Braces or Orthoses:  (No brace needed) Restrictions Weight Bearing Restrictions: No      Mobility  Bed Mobility               General bed mobility comments: Pt was received standing in room with mother present    Transfers Overall transfer level: Independent               General transfer comment: No assist required and no unsteadiness noted.  Ambulation/Gait Ambulation/Gait assistance: Independent Gait Distance (Feet): 400 Feet Assistive device: None Gait Pattern/deviations: WFL(Within Functional Limits) Gait velocity: Mildly decreased Gait velocity interpretation: >2.62 ft/sec, indicative of community ambulatory General Gait Details: No unsteadiness or LOB noted. Pt  appeared comfortable and confident throughout gait training.  Stairs            Wheelchair Mobility    Modified Rankin (Stroke Patients Only)       Balance Overall balance assessment: No apparent balance deficits (not formally assessed)                                           Pertinent Vitals/Pain Pain Assessment: Faces Pain Score: 6  Faces Pain Scale: Hurts a little bit Pain Location: Low back (incisional) Pain Descriptors / Indicators: Sore;Aching;Grimacing Pain Intervention(s): Limited activity within patient's tolerance;Monitored during session;Repositioned    Home Living Family/patient expects to be discharged to:: Private residence Living Arrangements: Alone Available Help at Discharge: Family;Available 24 hours/day Type of Home: House Home Access: Stairs to enter Entrance Stairs-Rails: None Entrance Stairs-Number of Steps: 1 Home Layout: One level Home Equipment: Hand held shower head      Prior Function Level of Independence: Independent         Comments: Independent with ADLs/IADLs. Works as a Agricultural consultant.     Hand Dominance   Dominant Hand: Right    Extremity/Trunk Assessment   Upper Extremity Assessment Upper Extremity Assessment: Defer to OT evaluation    Lower Extremity Assessment Lower Extremity Assessment: Overall WFL for tasks assessed (Mild weakness and muscular fatigue consistent with pre-op diagnosis)    Cervical / Trunk Assessment Cervical / Trunk Assessment: Other exceptions Cervical / Trunk Exceptions: s/p spinal surgery.  Communication   Communication: No difficulties  Cognition Arousal/Alertness: Awake/alert Behavior During Therapy: WFL for tasks assessed/performed Overall Cognitive Status:  Within Functional Limits for tasks assessed                                        General Comments General comments (skin integrity, edema, etc.): Incision clean/dry. Open to room  air.    Exercises     Assessment/Plan    PT Assessment Patent does not need any further PT services  PT Problem List         PT Treatment Interventions      PT Goals (Current goals can be found in the Care Plan section)  Acute Rehab PT Goals Patient Stated Goal: Get back to exercising PT Goal Formulation: All assessment and education complete, DC therapy    Frequency     Barriers to discharge        Co-evaluation               AM-PAC PT "6 Clicks" Mobility  Outcome Measure Help needed turning from your back to your side while in a flat bed without using bedrails?: None Help needed moving from lying on your back to sitting on the side of a flat bed without using bedrails?: None Help needed moving to and from a bed to a chair (including a wheelchair)?: None Help needed standing up from a chair using your arms (e.g., wheelchair or bedside chair)?: None Help needed to walk in hospital room?: None Help needed climbing 3-5 steps with a railing? : None 6 Click Score: 24    End of Session   Activity Tolerance: Patient tolerated treatment well Patient left: in bed;with call bell/phone within reach;with family/visitor present (Sitting EOB) Nurse Communication: Mobility status PT Visit Diagnosis: Pain Pain - part of body:  (back)    Time: 1039-1050 PT Time Calculation (min) (ACUTE ONLY): 11 min   Charges:   PT Evaluation $PT Eval Low Complexity: 1 Low          Conni Slipper, PT, DPT Acute Rehabilitation Services Pager: [phone] Office: [phone]   Marylynn Pearson 01/25/2021, 11:28 AM

## 2021-01-25 NOTE — Discharge Instructions (Signed)

## 2021-01-25 NOTE — Progress Notes (Signed)
Patient is discharged from room 3C11 at this time. Alert and in stable condition. IV site d/c'd and instructions read to patient and mother with understanding verbalized and all questions answered. Left unit via wheelchair with all belongings at side.

## 2021-05-01 IMAGING — RF DG C-ARM 1-60 MIN
1 series · 2 of 2 positions shown · non-contrast
Comparison: None.

CLINICAL DATA: Elective surgery.

EXAM:
LUMBAR SPINE - 2-3 VIEW; DG C-ARM 1-60 MIN

[Series 1: run · 2 of 2 slices shown]
[im 1/2]
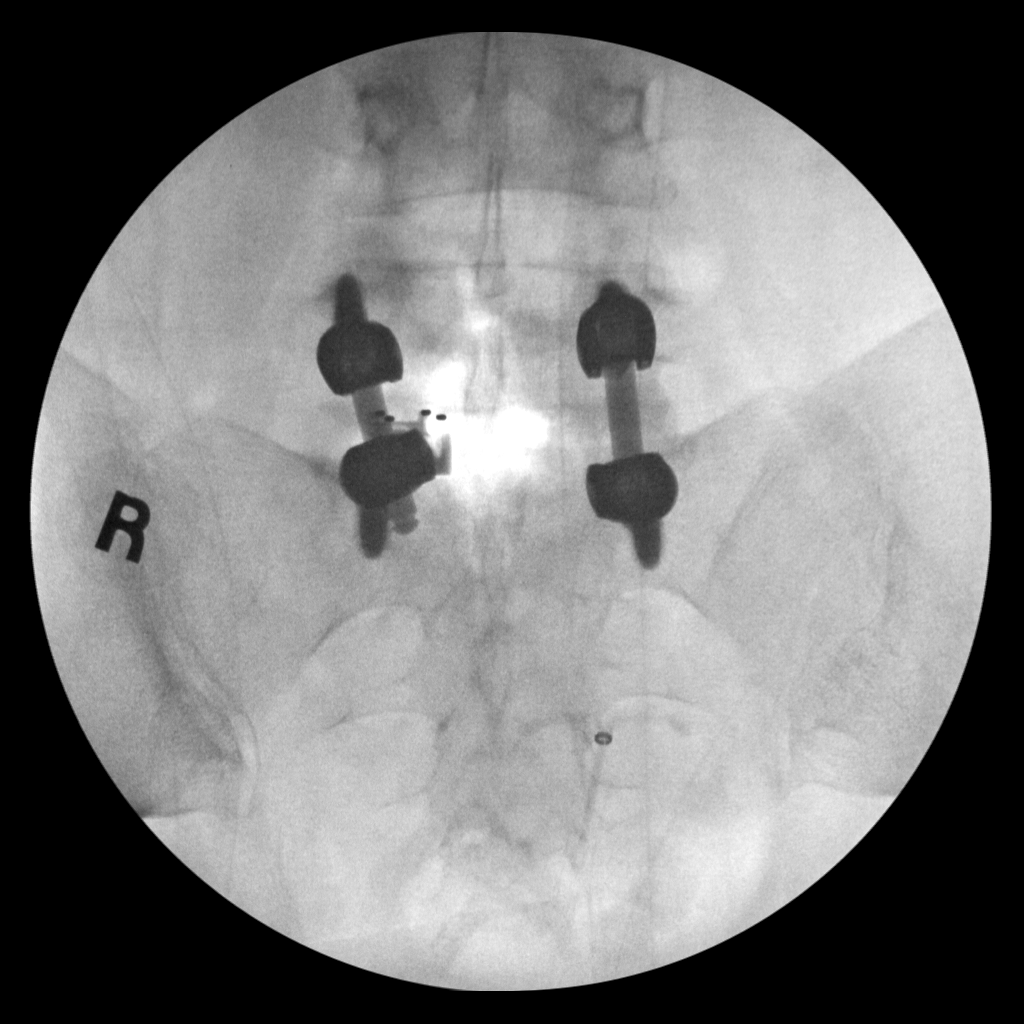
[im 2/2]
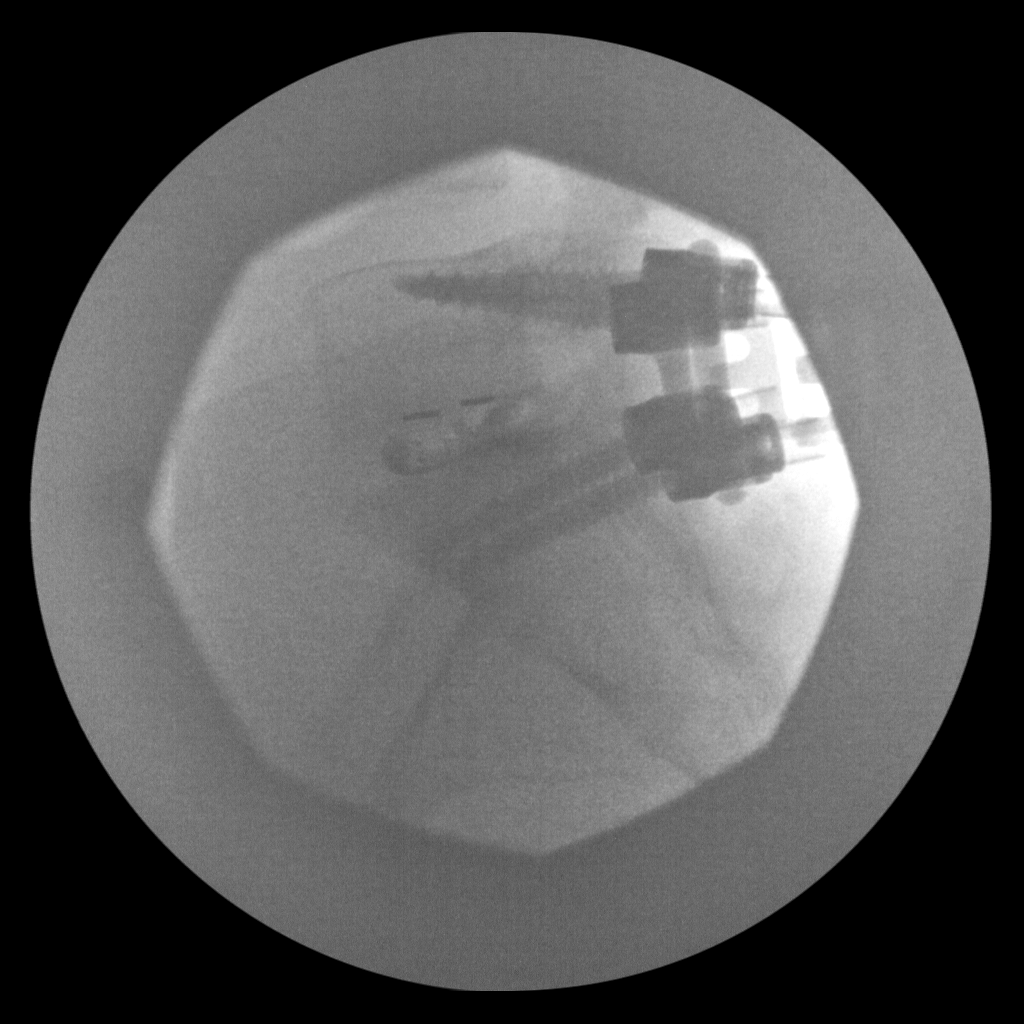

[2 of 2 positions shown; findings below may reference images not displayed]

FINDINGS: Frontal and lateral fluoroscopic spot views of the lumbar spine
obtained in the operating room. Posterior rod and intrapedicular
screws at L5 and S1. Interbody spacer. Total fluoroscopy time 1
minutes 14 seconds. Total dose 102.65 mGy.
IMPRESSION: Intraoperative fluoroscopy during L5-S1 lumbar fusion.
